# Patient Record
Sex: Female | Born: 1995 | Race: White | Hispanic: No | Marital: Married | Smoking: Never smoker
Health system: Southern US, Community
[De-identification: ages and names within clinical notes are randomized; demographics above are authoritative.]

## PROBLEM LIST (undated history)

## (undated) DIAGNOSIS — F419 Anxiety disorder, unspecified: Secondary | ICD-10-CM

## (undated) DIAGNOSIS — E282 Polycystic ovarian syndrome: Secondary | ICD-10-CM

## (undated) DIAGNOSIS — N390 Urinary tract infection, site not specified: Secondary | ICD-10-CM

## (undated) DIAGNOSIS — O24419 Gestational diabetes mellitus in pregnancy, unspecified control: Secondary | ICD-10-CM

## (undated) DIAGNOSIS — R7303 Prediabetes: Secondary | ICD-10-CM

## (undated) HISTORY — DX: Anxiety disorder, unspecified: F41.9

## (undated) HISTORY — PX: TONSILLECTOMY: SUR1361

---

## 2016-01-09 ENCOUNTER — Emergency Department (HOSPITAL_COMMUNITY)
Admission: EM | Admit: 2016-01-09 | Discharge: 2016-01-09 | Disposition: A | Discharge status: Home / Self Care | Payer: Self-pay | Attending: Emergency Medicine | Admitting: Emergency Medicine

## 2016-01-09 ENCOUNTER — Encounter (HOSPITAL_COMMUNITY): Payer: Self-pay

## 2016-01-09 DIAGNOSIS — Z8639 Personal history of other endocrine, nutritional and metabolic disease: Secondary | ICD-10-CM | POA: Insufficient documentation

## 2016-01-09 DIAGNOSIS — T148XXA Other injury of unspecified body region, initial encounter: Secondary | ICD-10-CM

## 2016-01-09 DIAGNOSIS — Z3202 Encounter for pregnancy test, result negative: Secondary | ICD-10-CM | POA: Insufficient documentation

## 2016-01-09 DIAGNOSIS — Y929 Unspecified place or not applicable: Secondary | ICD-10-CM | POA: Insufficient documentation

## 2016-01-09 DIAGNOSIS — Z880 Allergy status to penicillin: Secondary | ICD-10-CM | POA: Insufficient documentation

## 2016-01-09 DIAGNOSIS — Y939 Activity, unspecified: Secondary | ICD-10-CM | POA: Insufficient documentation

## 2016-01-09 DIAGNOSIS — Z87440 Personal history of urinary (tract) infections: Secondary | ICD-10-CM | POA: Insufficient documentation

## 2016-01-09 DIAGNOSIS — X58XXXA Exposure to other specified factors, initial encounter: Secondary | ICD-10-CM | POA: Insufficient documentation

## 2016-01-09 DIAGNOSIS — Y998 Other external cause status: Secondary | ICD-10-CM | POA: Insufficient documentation

## 2016-01-09 DIAGNOSIS — R350 Frequency of micturition: Secondary | ICD-10-CM | POA: Insufficient documentation

## 2016-01-09 DIAGNOSIS — M545 Low back pain: Secondary | ICD-10-CM | POA: Insufficient documentation

## 2016-01-09 DIAGNOSIS — T148 Other injury of unspecified body region: Secondary | ICD-10-CM | POA: Insufficient documentation

## 2016-01-09 HISTORY — DX: Prediabetes: R73.03

## 2016-01-09 HISTORY — DX: Urinary tract infection, site not specified: N39.0

## 2016-01-09 HISTORY — DX: Polycystic ovarian syndrome: E28.2

## 2016-01-09 LAB — URINALYSIS, ROUTINE W REFLEX MICROSCOPIC
BILIRUBIN URINE: NEGATIVE
Glucose, UA: NEGATIVE mg/dL
HGB URINE DIPSTICK: NEGATIVE
Ketones, ur: 15 mg/dL — AB
Leukocytes, UA: NEGATIVE
NITRITE: NEGATIVE
PH: 6 (ref 5.0–8.0)
Protein, ur: NEGATIVE mg/dL
SPECIFIC GRAVITY, URINE: 1.025 (ref 1.005–1.030)

## 2016-01-09 LAB — POC URINE PREG, ED: Preg Test, Ur: NEGATIVE

## 2016-01-09 MED ORDER — IBUPROFEN 600 MG PO TABS: 600.0000 mg | ORAL_TABLET | Freq: Four times a day (QID) | ORAL | Status: DC | PRN

## 2016-01-09 NOTE — ED Provider Notes (Signed)
CSN: [id number]     Arrival date & time 01/09/16  1741 History  By signing my name below, I, Linna DarnerRussell Turner, attest that this documentation has been prepared under the direction and in the presence of non-physician practitioner, Melburn HakeNicole Nadeau, PA-C. Electronically Signed: Linna Darnerussell Turner, Scribe. 01/09/2016. 6:22 PM.   Chief Complaint  Patient presents with  . Hip Pain    The history is provided by the patient. No language interpreter was used.     HPI Comments: Tara Porter is a 20 y.o. female with h/o prediabetes, PCOS and UTI who presents to the Emergency Department complaining of sudden onset, constant, worsening, dull, sharp, throbbing, anterior left hip pain for the last two weeks. Pt states that her pain radiates into her left lower back. She notes that her pain does not radiate down her left leg. Pt reports pain exacerbation with general movement, bending, palpation to her left hip/lower back, and laying flat. Pt also notes associated increased urinary frequency. She has not tried any medications for her pain. Pt states that she has not experienced similar pain in the past. Pt reports that she initially noticed her pain one morning before she went to work; she denies recent activity change or known cause of her pain. Denies any recent fall. Pt has not seen a chiropractor or acupuncturist recently. She has no h/o cancer, back pain, or IV drug use. Pt's LNMP was April 1st of this year. She is not on birth control. She denies fever, chills, dysuria, hematuria, abdominal pain, neck pain, bowel/bladder incontinence, numbness/tingling, neuro deficits, nausea, vomiting, constipation, or any other associated symptoms. She is ambulatory.   Past Medical History  Diagnosis Date  . UTI (lower urinary tract infection)   . PCOS (polycystic ovarian syndrome)   . Prediabetes    Past Surgical History  Procedure Laterality Date  . Tonsillectomy     History reviewed. No pertinent family history. Social  History  Substance Use Topics  . Smoking status: Never Smoker   . Smokeless tobacco: None  . Alcohol Use: Yes     Comment: occ   OB History    No data available     Review of Systems  Constitutional: Negative for fever.  Gastrointestinal: Negative for nausea, vomiting, abdominal pain and constipation.       Negative for bowel incontinence.  Genitourinary: Positive for frequency. Negative for dysuria and hematuria.       Negative for bladder incontinence.  Musculoskeletal: Positive for back pain (left lower) and arthralgias (left anterior hip). Negative for neck pain.  Neurological: Negative for numbness.       Negative for sensation loss.   Allergies  Amoxicillin  Home Medications   Prior to Admission medications   Medication Sig Start Date End Date Taking? Authorizing Provider  ibuprofen (ADVIL,MOTRIN) 600 MG tablet Take 1 tablet (600 mg total) by mouth every 6 (six) hours as needed. 01/09/16   Satira SarkNicole Elizabeth Nadeau, PA-C   BP 150/99 mmHg  Pulse 84  Temp(Src) 98.3 F (36.8 C) (Oral)  Resp 16  Ht 5\' 3"  (1.6 m)  Wt 122.471 kg  BMI 47.84 kg/m2  SpO2 100%  LMP 11/24/2015 (Approximate) Physical Exam  Constitutional: She is oriented to person, place, and time. She appears well-developed and well-nourished.  HENT:  Head: Normocephalic and atraumatic.  Eyes: Conjunctivae and EOM are normal. Right eye exhibits no discharge. Left eye exhibits no discharge. No scleral icterus.  Neck: Normal range of motion. Neck supple.  Cardiovascular: Normal rate, regular  rhythm, normal heart sounds and intact distal pulses.   Heart rate 92.  Pulmonary/Chest: Effort normal and breath sounds normal. No respiratory distress. She has no wheezes. She has no rales. She exhibits no tenderness.  Abdominal: Soft. She exhibits no distension and no mass. There is no tenderness. There is no rebound and no guarding.  No CVA tenderness.  Musculoskeletal: Normal range of motion. She exhibits tenderness.  She exhibits no edema.       Left hip: She exhibits tenderness. She exhibits normal range of motion, normal strength, no swelling, no crepitus, no deformity and no laceration.       Legs: No midline C, T, or L tenderness. Full range of motion of neck and back. Full range of motion of bilateral upper and lower extremities, with 5/5 strength. Mild tenderness to palpation over left lumbar paraspinal muscles and left anterior proximal thigh/hip. Sensation intact. 2+ radial and PT pulses. Cap refill <2 seconds. Patient able to stand and ambulate without assistance.   Neurological: She is alert and oriented to person, place, and time.  Skin: Skin is warm and dry.  Nursing note and vitals reviewed.   ED Course  Procedures (including critical care time)  DIAGNOSTIC STUDIES: Oxygen Saturation is 100% on RA, normal by my interpretation.    COORDINATION OF CARE: 6:22 PM Discussed treatment plan with pt at bedside and pt agreed to plan.  Labs Review Labs Reviewed  URINALYSIS, ROUTINE W REFLEX MICROSCOPIC (NOT AT [facility]) - Abnormal; Notable for the following:    Ketones, ur 15 (*)    All other components within normal limits  POC URINE PREG, ED    Imaging Review No results found. I have personally reviewed and evaluated these images and lab results as part of my medical decision-making.   EKG Interpretation None      MDM   Final diagnoses:  Muscle strain    Patient presents with right anterior hip pain that radiates around to her lower back. Denies any known injury, trauma or fall. She also reports having urinary frequency over the past few days. Denies fever. VSS. Exam revealed mild tenderness over left lumbar paraspinal muscles and left anterior proximal thigh/hip. No pelvic instability. No back pain red flags. No neuro deficits. Patient able to stand and ambulate without assistance. Full range of motion of bilateral hips. No abdominal tenderness. I suspect pt's pain is likely  musculoskeletal in etiology, however due to patient reporting urinary frequency and noting her LMP was 11/15/15, will order urine pregnancy and UA to evaluate for UTI/kidney stone. Pregnancy negative. UA unremarkable. I do not feel that any imaging is warranted at this time based off of exam and reported history. Plan to discharge patient home with symptomatic treatment including NSAIDs. Patient given information to follow-up with PCP if her symptoms do not improve over the next week. Discussed return precautions with patient.  I personally performed the services described in this documentation, which was scribed in my presence. The recorded information has been reviewed and is accurate.   Satira Sark [place], [place] 01/09/16 2106  Marily Memos, MD 01/10/16 [phone]

## 2016-01-09 NOTE — ED Notes (Signed)
Pt from home with complaint of left hip pain, worse with movement x 2 weeks. Pt ambulatory.

## 2016-01-09 NOTE — ED Notes (Signed)
Pt verbalized understanding of d/c instructions and has no further questions. Pt stable and NAD.  

## 2016-01-09 NOTE — Discharge Instructions (Signed)
I recommend taking 600 mg ibuprofen 4 times daily as needed for pain relief. You may also apply ice and/or heat to affected area for 15-20 minutes 3-4 times daily as needed for pain relief. Refrain from doing any squatting, heavy lifting or repetitive movements that exacerbate her symptoms for the next few days. Please follow up with a primary care provider from the Resource Guide provided below in one week if your symptoms have not improved. Please return to the Emergency Department if symptoms worsen or new onset of fever, numbness, tingling, groin numbness, abdominal pain, urinary retention, loss of bowel or bladder, weakness.

## 2016-11-30 ENCOUNTER — Inpatient Hospital Stay (HOSPITAL_COMMUNITY)
Admission: AD | Admit: 2016-11-30 | Discharge: 2016-11-30 | Disposition: A | Discharge status: Home / Self Care | Payer: 59 | Source: Ambulatory Visit | Attending: Obstetrics and Gynecology | Admitting: Obstetrics and Gynecology

## 2016-11-30 ENCOUNTER — Encounter (HOSPITAL_COMMUNITY): Payer: Self-pay

## 2016-11-30 ENCOUNTER — Inpatient Hospital Stay (HOSPITAL_COMMUNITY): Payer: 59

## 2016-11-30 DIAGNOSIS — O99280 Endocrine, nutritional and metabolic diseases complicating pregnancy, unspecified trimester: Secondary | ICD-10-CM | POA: Insufficient documentation

## 2016-11-30 DIAGNOSIS — Z7984 Long term (current) use of oral hypoglycemic drugs: Secondary | ICD-10-CM | POA: Diagnosis not present

## 2016-11-30 DIAGNOSIS — N939 Abnormal uterine and vaginal bleeding, unspecified: Secondary | ICD-10-CM | POA: Diagnosis present

## 2016-11-30 DIAGNOSIS — O039 Complete or unspecified spontaneous abortion without complication: Secondary | ICD-10-CM

## 2016-11-30 DIAGNOSIS — Z3A00 Weeks of gestation of pregnancy not specified: Secondary | ICD-10-CM | POA: Insufficient documentation

## 2016-11-30 DIAGNOSIS — O4690 Antepartum hemorrhage, unspecified, unspecified trimester: Secondary | ICD-10-CM | POA: Diagnosis not present

## 2016-11-30 DIAGNOSIS — O209 Hemorrhage in early pregnancy, unspecified: Secondary | ICD-10-CM

## 2016-11-30 DIAGNOSIS — E282 Polycystic ovarian syndrome: Secondary | ICD-10-CM | POA: Diagnosis not present

## 2016-11-30 DIAGNOSIS — Z880 Allergy status to penicillin: Secondary | ICD-10-CM | POA: Insufficient documentation

## 2016-11-30 LAB — HCG, QUANTITATIVE, PREGNANCY: HCG, BETA CHAIN, QUANT, S: 4798 m[IU]/mL — AB (ref <5)

## 2016-11-30 NOTE — MAU Provider Note (Signed)
History     Chief Complaint  Patient presents with  . Vaginal Bleeding   21 yo G1P0 SWF presents with c/o vaginal bleeding and passage of clots. Pt started spotting yesterday and it has  progressed to more clots  OB History    Gravida Para Term Preterm AB Living   2             SAB TAB Ectopic Multiple Live Births                  Past Medical History:  Diagnosis Date  . PCOS (polycystic ovarian syndrome)   . PCOS (polycystic ovarian syndrome)   . Prediabetes   . UTI (lower urinary tract infection)     Past Surgical History:  Procedure Laterality Date  . TONSILLECTOMY      History reviewed. No pertinent family history.  Social History  Substance Use Topics  . Smoking status: Never Smoker  . Smokeless tobacco: Never Used  . Alcohol use Yes     Comment: occ    Allergies:  Allergies  Allergen Reactions  . Amoxicillin Hives    Has patient had a PCN reaction causing immediate rash, facial/tongue/throat swelling, SOB or lightheadedness with hypotension: no Has patient had a PCN reaction causing severe rash involving mucus membranes or skin necrosis: no Has patient had a PCN reaction that required hospitalizationno Has patient had a PCN reaction occurring within the last 10 years: no If all of the above answers are "NO", then may proceed with Cephalosporin use.      Prescriptions Prior to Admission  Medication Sig Dispense Refill Last Dose  . calcium carbonate (TUMS - DOSED IN MG ELEMENTAL CALCIUM) 500 MG chewable tablet Chew 1 tablet by mouth as needed for indigestion or heartburn.   11/29/2016 at Unknown time  . metFORMIN (GLUCOPHAGE-XR) 500 MG 24 hr tablet Take 1 po qhs x 2 wks then take 2 tabs po qhs x 2 wks then take 3 tabs po qhs x 2 wks then take 4 tabs po qhs   11/29/2016 at Unknown time  . Prenatal Vit-Fe Fumarate-FA (PRENATAL MULTIVITAMIN) TABS tablet Take 1 tablet by mouth daily at 12 noon.   11/29/2016 at Unknown time     Physical Exam   Blood  pressure 130/64, pulse 99, temperature 98.4 F (36.9 C), temperature source Oral, resp. rate 18, height  (1.6 m), weight (!) 141.6 kg (312 lb 4 oz), last menstrual period 08/16/2016, SpO2 99 %.  General appearance: alert, cooperative and no distress Lungs: clear to auscultation bilaterally Heart: regular rate and rhythm, S1, S2 normal, no murmur, click, rub or gallop Abdomen: soft obese non tender Pelvic: vulva  smeared with blood , vagina; scant blood in vault. cervix closed/long.  uterus bulky adnexa non palp Extremities: no edema, redness or tenderness in the calves or thighs ED Course  IMP; vaginal bleeding in pregnancy P) TVS  MDM Addendum US Ob Comp Less 14 Wks  Result Date: 11/30/2016 CLINICAL DATA:  Inconclusive fetal viability. Vaginal bleeding with passage of clots. EXAM: OBSTETRIC <14 WK [place] AND TRANSVAGINAL OB US TECHNIQUE: Both transabdominal and transvaginal ultrasound examinations were performed for complete evaluation of the gestation as well as the maternal uterus, adnexal regions, and pelvic cul-de-sac. Transvaginal technique was performed to assess early pregnancy. COMPARISON:  None. FINDINGS: Intrauterine gestational sac: None Yolk sac:  Not Visualized. Embryo:  Not Visualized. Cardiac Activity: Not Visualized. Maternal uterus/adnexae: Subchorionic hemorrhage: Right ovary: Corpus luteal cyst noted. Left ovary:  Normal. Other :The endometrium measures up to 1.5 cm Free fluid:  Trace free fluid noted within the pelvis. IMPRESSION: 1. No intrauterine gestational sac, yolk sac, or fetal pole identified. Differential considerations include intrauterine pregnancy too early to be sonographically visualized, missed abortion, or ectopic pregnancy. Followup ultrasound is recommended in 10-14 days for further evaluation. 2. The endometrium measures 15 mm in maximum thickness. In the setting of missed abortion an endometrial thickness of greater than 10 mm is suspicious for retained  products of conception. Electronically Signed   By: Signa Kell M.D.   On: 11/30/2016 11:17   US Ob Transvaginal  Result Date: 11/30/2016 CLINICAL DATA:  Inconclusive fetal viability. Vaginal bleeding with passage of clots. EXAM: OBSTETRIC <14 WK [place] AND TRANSVAGINAL OB US TECHNIQUE: Both transabdominal and transvaginal ultrasound examinations were performed for complete evaluation of the gestation as well as the maternal uterus, adnexal regions, and pelvic cul-de-sac. Transvaginal technique was performed to assess early pregnancy. COMPARISON:  None. FINDINGS: Intrauterine gestational sac: None Yolk sac:  Not Visualized. Embryo:  Not Visualized. Cardiac Activity: Not Visualized. Maternal uterus/adnexae: Subchorionic hemorrhage: Right ovary: Corpus luteal cyst noted. Left ovary: Normal. Other :The endometrium measures up to 1.5 cm Free fluid:  Trace free fluid noted within the pelvis. IMPRESSION: 1. No intrauterine gestational sac, yolk sac, or fetal pole identified. Differential considerations include intrauterine pregnancy too early to be sonographically visualized, missed abortion, or ectopic pregnancy. Followup ultrasound is recommended in 10-14 days for further evaluation. 2. The endometrium measures 15 mm in maximum thickness. In the setting of missed abortion an endometrial thickness of greater than 10 mm is suspicious for retained products of conception. Electronically Signed   By: Signa Kell M.D.   On: 11/30/2016 11:17   IMP: SAB given prior sono in the office that showed IUP. Some residual POC RH neg but pt already had received Rhophylac P) d/c home. f/u 2 weeks. Heartstring info given. SAB precautions reviewed. Pelvic rest  Deray Dawes A, MD 10:01 AM 11/30/2016

## 2016-11-30 NOTE — Discharge Instructions (Signed)
SAB precautions. CALL  IF TEMP>100.4, NOTHING PER VAGINA X 2 WK, CALL IF SOAKING A MAXI  PAD EVERY HOUR OR MORE FREQUENTLY

## 2016-11-30 NOTE — MAU Note (Signed)
[facility] +preg hcg of 870; no  preg seen on [place]. Pt was to f/u with OB

## 2016-11-30 NOTE — MAU Note (Signed)
Urine in lab 

## 2016-12-02 ENCOUNTER — Other Ambulatory Visit: Payer: Self-pay | Admitting: Obstetrics and Gynecology

## 2017-09-30 ENCOUNTER — Encounter (HOSPITAL_COMMUNITY): Payer: Self-pay

## 2017-09-30 ENCOUNTER — Other Ambulatory Visit (HOSPITAL_COMMUNITY): Payer: Self-pay | Admitting: Obstetrics and Gynecology

## 2017-09-30 ENCOUNTER — Ambulatory Visit (HOSPITAL_COMMUNITY)
Admission: RE | Admit: 2017-09-30 | Discharge: 2017-09-30 | Disposition: A | Discharge status: Home / Self Care | Payer: 59 | Source: Ambulatory Visit | Attending: Obstetrics and Gynecology | Admitting: Obstetrics and Gynecology

## 2017-09-30 DIAGNOSIS — O26899 Other specified pregnancy related conditions, unspecified trimester: Secondary | ICD-10-CM

## 2017-09-30 DIAGNOSIS — R1032 Left lower quadrant pain: Secondary | ICD-10-CM

## 2017-09-30 DIAGNOSIS — O26892 Other specified pregnancy related conditions, second trimester: Secondary | ICD-10-CM | POA: Insufficient documentation

## 2017-09-30 DIAGNOSIS — R1030 Lower abdominal pain, unspecified: Secondary | ICD-10-CM | POA: Diagnosis present

## 2017-09-30 DIAGNOSIS — Z3A25 25 weeks gestation of pregnancy: Secondary | ICD-10-CM | POA: Insufficient documentation

## 2017-10-12 ENCOUNTER — Other Ambulatory Visit: Payer: Self-pay

## 2017-10-12 ENCOUNTER — Inpatient Hospital Stay (HOSPITAL_COMMUNITY): Payer: 59

## 2017-10-12 ENCOUNTER — Inpatient Hospital Stay (HOSPITAL_COMMUNITY)
Admission: AD | Admit: 2017-10-12 | Discharge: 2017-10-21 | DRG: 783 | Disposition: A | Discharge status: Home / Self Care | Payer: 59 | Source: Ambulatory Visit | Attending: Obstetrics and Gynecology | Admitting: Obstetrics and Gynecology

## 2017-10-12 ENCOUNTER — Encounter (HOSPITAL_COMMUNITY): Payer: Self-pay

## 2017-10-12 DIAGNOSIS — Z6791 Unspecified blood type, Rh negative: Secondary | ICD-10-CM | POA: Diagnosis not present

## 2017-10-12 DIAGNOSIS — Z3A28 28 weeks gestation of pregnancy: Secondary | ICD-10-CM

## 2017-10-12 DIAGNOSIS — D62 Acute posthemorrhagic anemia: Secondary | ICD-10-CM | POA: Diagnosis not present

## 2017-10-12 DIAGNOSIS — O4290 Premature rupture of membranes, unspecified as to length of time between rupture and onset of labor, unspecified weeks of gestation: Secondary | ICD-10-CM

## 2017-10-12 DIAGNOSIS — O4692 Antepartum hemorrhage, unspecified, second trimester: Secondary | ICD-10-CM | POA: Diagnosis present

## 2017-10-12 DIAGNOSIS — O411220 Chorioamnionitis, second trimester, not applicable or unspecified: Secondary | ICD-10-CM | POA: Diagnosis present

## 2017-10-12 DIAGNOSIS — O42912 Preterm premature rupture of membranes, unspecified as to length of time between rupture and onset of labor, second trimester: Principal | ICD-10-CM | POA: Diagnosis present

## 2017-10-12 DIAGNOSIS — O24410 Gestational diabetes mellitus in pregnancy, diet controlled: Secondary | ICD-10-CM

## 2017-10-12 DIAGNOSIS — Z3A27 27 weeks gestation of pregnancy: Secondary | ICD-10-CM

## 2017-10-12 DIAGNOSIS — O99344 Other mental disorders complicating childbirth: Secondary | ICD-10-CM | POA: Diagnosis present

## 2017-10-12 DIAGNOSIS — D649 Anemia, unspecified: Secondary | ICD-10-CM

## 2017-10-12 DIAGNOSIS — O99214 Obesity complicating childbirth: Secondary | ICD-10-CM | POA: Diagnosis present

## 2017-10-12 DIAGNOSIS — Z880 Allergy status to penicillin: Secondary | ICD-10-CM

## 2017-10-12 DIAGNOSIS — O24425 Gestational diabetes mellitus in childbirth, controlled by oral hypoglycemic drugs: Secondary | ICD-10-CM | POA: Diagnosis present

## 2017-10-12 DIAGNOSIS — O26892 Other specified pregnancy related conditions, second trimester: Secondary | ICD-10-CM | POA: Diagnosis present

## 2017-10-12 DIAGNOSIS — F419 Anxiety disorder, unspecified: Secondary | ICD-10-CM | POA: Diagnosis present

## 2017-10-12 DIAGNOSIS — E282 Polycystic ovarian syndrome: Secondary | ICD-10-CM | POA: Diagnosis present

## 2017-10-12 DIAGNOSIS — N838 Other noninflammatory disorders of ovary, fallopian tube and broad ligament: Secondary | ICD-10-CM | POA: Diagnosis present

## 2017-10-12 DIAGNOSIS — O3482 Maternal care for other abnormalities of pelvic organs, second trimester: Secondary | ICD-10-CM | POA: Diagnosis present

## 2017-10-12 DIAGNOSIS — O864 Pyrexia of unknown origin following delivery: Secondary | ICD-10-CM

## 2017-10-12 DIAGNOSIS — O99284 Endocrine, nutritional and metabolic diseases complicating childbirth: Secondary | ICD-10-CM | POA: Diagnosis present

## 2017-10-12 DIAGNOSIS — O9081 Anemia of the puerperium: Secondary | ICD-10-CM | POA: Diagnosis not present

## 2017-10-12 HISTORY — DX: Gestational diabetes mellitus in pregnancy, unspecified control: O24.419

## 2017-10-12 LAB — COMPREHENSIVE METABOLIC PANEL
ALT: 14 U/L (ref 14–54)
AST: 17 U/L (ref 15–41)
Albumin: 2.9 g/dL — ABNORMAL LOW (ref 3.5–5.0)
Alkaline Phosphatase: 73 U/L (ref 38–126)
Anion gap: 9 (ref 5–15)
BUN: 6 mg/dL (ref 6–20)
CO2: 20 mmol/L — ABNORMAL LOW (ref 22–32)
Calcium: 8.5 mg/dL — ABNORMAL LOW (ref 8.9–10.3)
Chloride: 102 mmol/L (ref 101–111)
Creatinine, Ser: 0.36 mg/dL — ABNORMAL LOW (ref 0.44–1.00)
GFR calc Af Amer: >60 mL/min (ref >60)
GFR calc non Af Amer: >60 mL/min (ref >60)
Glucose, Bld: 93 mg/dL (ref 65–99)
Potassium: 3.9 mmol/L (ref 3.5–5.1)
Sodium: 131 mmol/L — ABNORMAL LOW (ref 135–145)
Total Bilirubin: 0.6 mg/dL (ref 0.3–1.2)
Total Protein: 6.2 g/dL — ABNORMAL LOW (ref 6.5–8.1)

## 2017-10-12 LAB — CBC
HCT: 32.7 % — ABNORMAL LOW (ref 36.0–46.0)
HEMOGLOBIN: 10.5 g/dL — AB (ref 12.0–15.0)
MCH: 26.3 pg (ref 26.0–34.0)
MCHC: 32.1 g/dL (ref 30.0–36.0)
MCV: 82 fL (ref 78.0–100.0)
PLATELETS: 360 10*3/uL (ref 150–400)
RBC: 3.99 MIL/uL (ref 3.87–5.11)
RDW: 14.1 % (ref 11.5–15.5)
WBC: 21.7 10*3/uL — ABNORMAL HIGH (ref 4.0–10.5)

## 2017-10-12 LAB — TYPE AND SCREEN
ABO/RH(D): A NEG
Antibody Screen: NEGATIVE

## 2017-10-12 LAB — PROTEIN / CREATININE RATIO, URINE
Creatinine, Urine: 88 mg/dL
Protein Creatinine Ratio: 0.11 mg/mg{Cre} (ref 0.00–0.15)
Total Protein, Urine: 10 mg/dL

## 2017-10-12 LAB — GLUCOSE, CAPILLARY
GLUCOSE-CAPILLARY: 172 mg/dL — AB (ref 65–99)
Glucose-Capillary: 127 mg/dL — ABNORMAL HIGH (ref 65–99)
Glucose-Capillary: 147 mg/dL — ABNORMAL HIGH (ref 65–99)

## 2017-10-12 LAB — ABO/RH: ABO/RH(D): A NEG

## 2017-10-12 LAB — AMNISURE RUPTURE OF MEMBRANE (ROM) NOT AT ARMC: Amnisure ROM: NEGATIVE

## 2017-10-12 LAB — URIC ACID: URIC ACID, SERUM: 3.9 mg/dL (ref 2.3–6.6)

## 2017-10-12 MED ORDER — ACETAMINOPHEN 325 MG PO TABS
650.0000 mg | ORAL_TABLET | ORAL | Status: DC | PRN
  Administered 2017-10-13 – 2017-10-16 (×6): 650 mg via ORAL
  Filled 2017-10-12 (×6): qty 2

## 2017-10-12 MED ORDER — BETAMETHASONE SOD PHOS & ACET 6 (3-3) MG/ML IJ SUSP
12.0000 mg | INTRAMUSCULAR | Status: AC
Start: 2017-10-12 — End: 2017-10-13
  Administered 2017-10-12 – 2017-10-13 (×2): 12 mg via INTRAMUSCULAR
  Filled 2017-10-12 (×2): qty 2

## 2017-10-12 MED ORDER — ZOLPIDEM TARTRATE 5 MG PO TABS
5.0000 mg | ORAL_TABLET | Freq: Every evening | ORAL | Status: DC | PRN
  Administered 2017-10-13: 5 mg via ORAL
  Filled 2017-10-12: qty 1

## 2017-10-12 MED ORDER — SODIUM CHLORIDE 0.9% FLUSH: 3.0000 mL | INTRAVENOUS | Status: DC | PRN

## 2017-10-12 MED ORDER — SODIUM CHLORIDE 0.9% FLUSH
3.0000 mL | Freq: Two times a day (BID) | INTRAVENOUS | Status: DC
  Administered 2017-10-12 – 2017-10-16 (×6): 3 mL via INTRAVENOUS

## 2017-10-12 MED ORDER — DOCUSATE SODIUM 100 MG PO CAPS
100.0000 mg | ORAL_CAPSULE | Freq: Every day | ORAL | Status: DC
  Administered 2017-10-12 – 2017-10-15 (×4): 100 mg via ORAL
  Filled 2017-10-12 (×6): qty 1

## 2017-10-12 MED ORDER — PRENATAL MULTIVITAMIN CH
1.0000 | ORAL_TABLET | Freq: Every day | ORAL | Status: DC
  Administered 2017-10-12 – 2017-10-16 (×5): 1 via ORAL
  Filled 2017-10-12 (×6): qty 1

## 2017-10-12 MED ORDER — CALCIUM CARBONATE ANTACID 500 MG PO CHEW
2.0000 | CHEWABLE_TABLET | ORAL | Status: DC | PRN
  Administered 2017-10-14: 400 mg via ORAL
  Filled 2017-10-12: qty 2

## 2017-10-12 MED ORDER — SODIUM CHLORIDE 0.9 % IV SOLN: 250.0000 mL | INTRAVENOUS | Status: DC | PRN

## 2017-10-12 NOTE — H&P (Signed)
Tara Porter is a 22 y.o. female presenting @v  27 4/[redacted] weeks gestation with c/o leaking fluid and vaginal bleeding. Evaluation done showed neg amniosure. sono done: nl AFI( 17cm), AGA baby and no evidence of abruption or previa. Pt ambulated to bathroom and had another episode of vaginal bleeding OB History    Gravida Para Term Preterm AB Living   2       1     SAB TAB Ectopic Multiple Live Births   1             Past Medical History:  Diagnosis Date  . PCOS (polycystic ovarian syndrome)   . PCOS (polycystic ovarian syndrome)   . Prediabetes   . UTI (lower urinary tract infection)    Past Surgical History:  Procedure Laterality Date  . TONSILLECTOMY     Family History: family history is not on file. Social History:  reports that  has never smoked. she has never used smokeless tobacco. She reports that she does not drink alcohol or use drugs.     Maternal Diabetes: Yes:  Diabetes Type:  Diet controlled Genetic Screening: Normal Maternal Ultrasounds/Referrals: Normal Fetal Ultrasounds or other Referrals:  None Maternal Substance Abuse:  No Significant Maternal Medications:  None Significant Maternal Lab Results:  Lab values include: Rh negativeGBS to be done Other Comments:  morbid obesity, PCOS  Review of Systems  All other systems reviewed and are negative.  History   Blood pressure (!) 166/66, pulse 97, temperature 97.8 F (36.6 C), temperature source Oral, resp. rate 19, height 5\' 3"  (1.6 m), weight (!) 142.4 kg (314 lb), SpO2 98 %, unknown if currently breastfeeding.  Patient Vitals for the past 24 hrs:  BP Temp Temp src Pulse Resp SpO2 Height Weight  10/12/17 0737 (!) 165/81 - - (!) 102 - - - -  10/12/17 0711 (!) 154/74 - - 96 - - - -  10/12/17 0659 (!) 166/66 97.8 F (36.6 C) Oral 97 19 98 % - -  10/12/17 0335 (!) 128/57 97.9 F (36.6 C) Oral 93 19 99 % 5\' 3"  (1.6 m) (!) 142.4 kg (314 lb)   Exam Physical Exam  Constitutional: She is oriented to person, place,  and time. She appears well-developed and well-nourished.  HENT:  Head: Atraumatic.  Eyes: EOM are normal.  Neck: Neck supple.  Cardiovascular: Regular rhythm.  Respiratory: Breath sounds normal.  GI: Soft.  Obese nontender  Genitourinary:  Genitourinary Comments: Cervix closed on visual exam. Scant blood  Musculoskeletal: She exhibits no edema.  Neurological: She is alert and oriented to person, place, and time.  Skin: Skin is warm and dry.    Prenatal labs: ABO, Rh:  A negative Antibody:  neg Rubella:  Immune RPR:   NR HBsAg:   Neg HIV:   neg GBS:   to be done  Assessment/Plan: 2nd trimester vaginal bleeding unexplained IUP @ 27 4/7 wks Class A1 GDM ? Elevated BP due to anxiety P) admit. BMZ x 2. CBG. Continuous fetal monitoring. GBS cx. Monitor bleeding. PIH labs, protein/creatinine ratio    Tara Porter A Tara Porter 10/12/2017, 7:32 AM

## 2017-10-12 NOTE — Discharge Instructions (Signed)
Monitor bleeding. Call if recurrence of symptoms. Keep scheduled OB appt

## 2017-10-12 NOTE — Progress Notes (Signed)
Initial Nutrition Assessment  DOCUMENTATION CODES:   Morbid obesity  INTERVENTION:  GDM diet Double protein portions, CHO limit increased to 45 g at B, 60 g at L& D  NUTRITION DIAGNOSIS:  Increased nutrient needs related to (pregnancy and fetal growth requirments) as evidenced by (27 weeks IUP). GOAL:  Patient will meet greater than or equal to 90% of their needs  MONITOR:  Labs REASON FOR ASSESSMENT:  Antenatal, Gestational Diabetes   ASSESSMENT:  27 4/7 weks, vaginal bleed. Hx of PCOS, A1 GDM.  Pre-preg weight 280 lbs, BMI 49.7. 34 lb weight gain Pt hungry between meals   Diet Order:  Diet Carb Modified Diet gestational carb mod Room service appropriate? Yes; Fluid consistency: Thin  EDUCATION NEEDS:  No education needs have been identified at this time Told to eliminate sugar and limit CHO intake at Physicians office. Glucose levels at home reported by pt to be wnl  Skin:  Skin Assessment: Reviewed RN Assessment Height:   Ht Readings from Last 1 Encounters:  10/12/17 5\' 3"  (1.6 m)   Weight:   Wt Readings from Last 1 Encounters:  10/12/17 (!) 314 lb (142.4 kg)   Ideal Body Weight:   115 lbs  BMI:  Body mass index is 55.62 kg/m.  Estimated Nutritional Needs:  Kcal:  2400-2600 Protein:  105-115 g Fluid:  2.7 L   Tara Porter M.Odis LusterEd. R.D. LDN Neonatal Nutrition Support Specialist/RD III Pager [phone]      Phone [phone]

## 2017-10-12 NOTE — MAU Note (Signed)
Pt reports that she woke up and went to the bathroom and had a gush of pinkish fluid. Has continued to trickle out. Pt reports feeling lower abdominal cramping that started after the gush. Rates 3/10. States she has not felt baby move since about midnight. FHT 155 in triage

## 2017-10-12 NOTE — MAU Provider Note (Signed)
History     Chief Complaint  Patient presents with  . Rupture of Membranes  . Vaginal Bleeding   22 yo G2P0010 SWF @ 27w 4d pregnant presents with c/o leaking fluid with blood this am. (+) abdominal cramping. PNC complicated by morbid obesity, class A1 GDM  OB History    Gravida Para Term Preterm AB Living   2       1     SAB TAB Ectopic Multiple Live Births   1              Past Medical History:  Diagnosis Date  . PCOS (polycystic ovarian syndrome)   . PCOS (polycystic ovarian syndrome)   . Prediabetes   . UTI (lower urinary tract infection)     Past Surgical History:  Procedure Laterality Date  . TONSILLECTOMY      History reviewed. No pertinent family history.  Social History   Tobacco Use  . Smoking status: Never Smoker  . Smokeless tobacco: Never Used  Substance Use Topics  . Alcohol use: No    Frequency: Never  . Drug use: No    Allergies:  Allergies  Allergen Reactions  . Amoxicillin Hives    Has patient had a PCN reaction causing immediate rash, facial/tongue/throat swelling, SOB or lightheadedness with hypotension: no Has patient had a PCN reaction causing severe rash involving mucus membranes or skin necrosis: no Has patient had a PCN reaction that required hospitalizationno Has patient had a PCN reaction occurring within the last 10 years: no If all of the above answers are "NO", then may proceed with Cephalosporin use.      Medications Prior to Admission  Medication Sig Dispense Refill Last Dose  . calcium carbonate (TUMS - DOSED IN MG ELEMENTAL CALCIUM) 500 MG chewable tablet Chew 1 tablet by mouth as needed for indigestion or heartburn.   Past Week at Unknown time  . Prenatal Vit-Fe Fumarate-FA (PRENATAL MULTIVITAMIN) TABS tablet Take 1 tablet by mouth daily at 12 noon.   10/11/2017 at 0900  . metFORMIN (GLUCOPHAGE-XR) 500 MG 24 hr tablet Take 1 po qhs x 2 wks then take 2 tabs po qhs x 2 wks then take 3 tabs po qhs x 2 wks then take 4 tabs po  qhs   More than a month at Unknown time     Physical Exam   Blood pressure (!) 128/57, pulse 93, temperature 97.9 F (36.6 C), temperature source Oral, resp. rate 19, height 5\' 3"  (1.6 m), weight (!) 142.4 kg (314 lb), SpO2 99 %, unknown if currently breastfeeding.  General appearance: alert, cooperative, no distress and morbidly obese Lungs: clear to auscultation bilaterally Heart: regular rate and rhythm, S1, S2 normal, no murmur, click, rub or gallop Abdomen: gravid soft nontender Pelvic: external genitalia normal and vagina. scant blood. cervix visually closed inner thigh smeared with blood Extremity: no edema  Tracing: baseline 140 NR no ctx ED Course  IMP: 2nd trimester vaginal bleeding Leaking fluid IUP@ 27 4/7 weeks Class A1 GDM P) amniosure. OB sono ( EFW, AFI, check placenta MDM   Serita KyleSheronette A Nichelle Renwick, MD 4:28 AM 10/12/2017

## 2017-10-13 LAB — FIBRINOGEN: Fibrinogen: 411 mg/dL (ref 210–475)

## 2017-10-13 LAB — GLUCOSE, CAPILLARY
GLUCOSE-CAPILLARY: 149 mg/dL — AB (ref 65–99)
GLUCOSE-CAPILLARY: 182 mg/dL — AB (ref 65–99)
Glucose-Capillary: 129 mg/dL — ABNORMAL HIGH (ref 65–99)
Glucose-Capillary: 176 mg/dL — ABNORMAL HIGH (ref 65–99)

## 2017-10-13 LAB — PROTIME-INR
INR: 0.98
Prothrombin Time: 12.9 seconds (ref 11.4–15.2)

## 2017-10-13 LAB — PLATELET COUNT: Platelets: 366 10*3/uL (ref 150–400)

## 2017-10-13 LAB — RPR: RPR: NONREACTIVE

## 2017-10-13 LAB — APTT: APTT: 24 s (ref 24–36)

## 2017-10-13 LAB — SAVE SMEAR

## 2017-10-13 NOTE — Progress Notes (Addendum)
HD #2 Iup@ 27 5/7 2nd trim vaginal bleeding Class A1 GDM s/p BMZ x 2  S: pt noted painless vaginal bleeding at 12 noon today. (+) FM  BP (!) 116/54 (BP Location: Right Arm)   Pulse 91   Temp 98.4 F (36.9 C) (Oral)   Resp 18   Ht 5\' 3"  (1.6 m)   Wt (!) 140.6 kg (310 lb 0.2 oz)   SpO2 98%   BMI 54.92 kg/m  Lungs clear to A  Cor RRR Abd: soft gravid non tender Pad: BRB noted on original pad. Now brown blood VE deferred Extremity no edema or calf tenderness  Tracing: baseline 150 NR No ctx noted  CBG  129/149/182  IMP: unexplained 2nd trimester vaginal bleeding ? Marginal separation Morbid obesity Class a1 GDM with elevated BS due to BMZ P) DIC panel. Remain pt. May start metformin if BS persists NICU consult. Start metformin

## 2017-10-14 LAB — CULTURE, BETA STREP (GROUP B ONLY)

## 2017-10-14 LAB — GLUCOSE, CAPILLARY
Glucose-Capillary: 109 mg/dL — ABNORMAL HIGH (ref 65–99)
Glucose-Capillary: 90 mg/dL (ref 65–99)

## 2017-10-14 LAB — AMNISURE RUPTURE OF MEMBRANE (ROM) NOT AT ARMC: Amnisure ROM: POSITIVE

## 2017-10-14 MED ORDER — MAGNESIUM SULFATE BOLUS VIA INFUSION
4.0000 g | Freq: Once | INTRAVENOUS | Status: AC
  Administered 2017-10-14: 4 g via INTRAVENOUS
  Filled 2017-10-14: qty 500

## 2017-10-14 MED ORDER — METFORMIN HCL 500 MG PO TABS
500.0000 mg | ORAL_TABLET | Freq: Two times a day (BID) | ORAL | Status: DC
  Administered 2017-10-14 – 2017-10-16 (×6): 500 mg via ORAL
  Filled 2017-10-14 (×7): qty 1

## 2017-10-14 MED ORDER — SODIUM CHLORIDE 0.9 % IV SOLN
500.0000 mg | INTRAVENOUS | Status: AC
  Administered 2017-10-14 – 2017-10-15 (×2): 500 mg via INTRAVENOUS
  Filled 2017-10-14 (×2): qty 500

## 2017-10-14 MED ORDER — MAGNESIUM SULFATE 40 G IN LACTATED RINGERS - SIMPLE
2.0000 g/h | INTRAVENOUS | Status: AC
  Filled 2017-10-14: qty 500

## 2017-10-14 MED ORDER — LACTATED RINGERS IV SOLN
INTRAVENOUS | Status: DC
  Administered 2017-10-14 – 2017-10-16 (×3): via INTRAVENOUS

## 2017-10-14 MED ORDER — AZITHROMYCIN 250 MG PO TABS: 500.0000 mg | ORAL_TABLET | Freq: Every day | ORAL | Status: DC

## 2017-10-14 NOTE — Progress Notes (Signed)
Called by RN regarding PPROM @ 6:30 PM clear fluid. amni sure done and is positive. GBS Cx neg. Will proceed with PPROM protocol. Await NICU consult and will do magnesium neuro prophylaxis. Pt and family notified of plan

## 2017-10-14 NOTE — Consult Note (Signed)
Asked by Dr.Cousins to provide prenatal consultation for patient at risk for preterm delivery due to PPROM.  Mother is 22 y.o. G2 P0 who is now 27.[redacted] weeks EGA, admitted 2/27 with leaking, bleeding. Was Amniosure negative on admission but positive today.  Also has diet-controlled gestational DM, given metformin today after increased glucose s/p BMZ x 2. Now being started on Mg for neuroprotection.  Discussed usual expectations for preterm infant at [redacted] weeks gestation, including possible needs for DR resuscitation, respiratory support, IV access, and blood products.  Presented risks of death or serious morbidity but noted most infants at 28 wks s/p BMZ do well in the long run. [facility]  Discussed advantages of feeding with mother's milk.  She plans to pump postnatally.  Patient was attentive, had appropriate questions, and was appreciative of my input.  Thank you for consulting Neonatology.  Total time 25 minutes  JWimmer, MD

## 2017-10-15 LAB — GLUCOSE, CAPILLARY
GLUCOSE-CAPILLARY: 102 mg/dL — AB (ref 65–99)
GLUCOSE-CAPILLARY: 103 mg/dL — AB (ref 65–99)
GLUCOSE-CAPILLARY: 125 mg/dL — AB (ref 65–99)
GLUCOSE-CAPILLARY: 71 mg/dL (ref 65–99)
GLUCOSE-CAPILLARY: 82 mg/dL (ref 65–99)
GLUCOSE-CAPILLARY: 92 mg/dL (ref 65–99)

## 2017-10-15 MED ORDER — RHO D IMMUNE GLOBULIN 1500 UNIT/2ML IJ SOSY
300.0000 ug | PREFILLED_SYRINGE | Freq: Once | INTRAMUSCULAR | Status: AC
  Administered 2017-10-15: 300 ug via INTRAVENOUS
  Filled 2017-10-15: qty 2

## 2017-10-15 NOTE — Progress Notes (Signed)
S: leaking fluic (+) FM Denies CTX  O: BP 111/74 (BP Location: Right Arm)   Pulse 88   Temp 97.9 F (36.6 C) (Oral)   Resp 18   Ht 5\' 3"  (1.6 m)   Wt (!) 139.7 kg (308 lb 0.6 oz)   SpO2 100%   BMI 54.57 kg/m  Lungs clear To A COr RRR Abd; gravid soft nontender Pad . Clear fluid with pink tinge Extr: no edema  Tracing: baseline 145 occ variable No ctx  IMP: PPROM on azithromycin GBS cx neg  Class A2 GDM on metformin s/p BMZ Unexplained vaginal bleeding IUP@ 28 weeks Rh negative P) cont antibiotic per protocol. Rhophylac today Cont fetal monitor. Seen by NICU note appreciated

## 2017-10-16 ENCOUNTER — Encounter (HOSPITAL_COMMUNITY)
Admission: AD | Disposition: A | Discharge status: Home / Self Care | Payer: Self-pay | Source: Ambulatory Visit | Attending: Obstetrics and Gynecology

## 2017-10-16 ENCOUNTER — Inpatient Hospital Stay (HOSPITAL_COMMUNITY): Payer: 59 | Admitting: Anesthesiology

## 2017-10-16 ENCOUNTER — Inpatient Hospital Stay (HOSPITAL_COMMUNITY): Payer: 59

## 2017-10-16 ENCOUNTER — Encounter (HOSPITAL_COMMUNITY): Payer: Self-pay

## 2017-10-16 LAB — COMPREHENSIVE METABOLIC PANEL
ALT: 18 U/L (ref 14–54)
AST: 19 U/L (ref 15–41)
Albumin: 2.7 g/dL — ABNORMAL LOW (ref 3.5–5.0)
Alkaline Phosphatase: 70 U/L (ref 38–126)
Anion gap: 8 (ref 5–15)
BUN: 7 mg/dL (ref 6–20)
CO2: 21 mmol/L — ABNORMAL LOW (ref 22–32)
Calcium: 8.5 mg/dL — ABNORMAL LOW (ref 8.9–10.3)
Chloride: 105 mmol/L (ref 101–111)
Creatinine, Ser: 0.4 mg/dL — ABNORMAL LOW (ref 0.44–1.00)
GFR calc Af Amer: >60 mL/min (ref >60)
GFR calc non Af Amer: >60 mL/min (ref >60)
Glucose, Bld: 77 mg/dL (ref 65–99)
Potassium: 3.8 mmol/L (ref 3.5–5.1)
Sodium: 134 mmol/L — ABNORMAL LOW (ref 135–145)
Total Bilirubin: 0.6 mg/dL (ref 0.3–1.2)
Total Protein: 6.3 g/dL — ABNORMAL LOW (ref 6.5–8.1)

## 2017-10-16 LAB — CBC WITH DIFFERENTIAL/PLATELET
Basophils Absolute: 0 10*3/uL (ref 0.0–0.1)
Basophils Relative: 0 %
EOS ABS: 0.3 10*3/uL (ref 0.0–0.7)
Eosinophils Relative: 2 %
HCT: 33.7 % — ABNORMAL LOW (ref 36.0–46.0)
Hemoglobin: 10.7 g/dL — ABNORMAL LOW (ref 12.0–15.0)
LYMPHS ABS: 2.3 10*3/uL (ref 0.7–4.0)
Lymphocytes Relative: 11 %
MCH: 26.9 pg (ref 26.0–34.0)
MCHC: 31.8 g/dL (ref 30.0–36.0)
MCV: 84.7 fL (ref 78.0–100.0)
MONO ABS: 1.1 10*3/uL — AB (ref 0.1–1.0)
MONOS PCT: 5 %
Neutro Abs: 17.8 10*3/uL (ref 1.7–7.7)
Neutrophils Relative %: 82 %
PLATELETS: 374 10*3/uL (ref 150–400)
RBC: 3.98 MIL/uL (ref 3.87–5.11)
RDW: 14.6 % (ref 11.5–15.5)
WBC: 21.6 10*3/uL — ABNORMAL HIGH (ref 4.0–10.5)

## 2017-10-16 LAB — GLUCOSE, CAPILLARY
GLUCOSE-CAPILLARY: 77 mg/dL (ref 65–99)
Glucose-Capillary: 96 mg/dL (ref 65–99)
Glucose-Capillary: 101 mg/dL — ABNORMAL HIGH (ref 65–99)

## 2017-10-16 LAB — RH IG WORKUP (INCLUDES ABO/RH)
ABO/RH(D): A NEG
Fetal Screen: NEGATIVE
GESTATIONAL AGE(WKS): 28
Unit division: 0

## 2017-10-16 SURGERY — Surgical Case
Anesthesia: Regional

## 2017-10-16 MED ORDER — NALBUPHINE HCL 10 MG/ML IJ SOLN: 5.0000 mg | INTRAMUSCULAR | Status: DC | PRN

## 2017-10-16 MED ORDER — NALBUPHINE HCL 10 MG/ML IJ SOLN: 5.0000 mg | Freq: Once | INTRAMUSCULAR | Status: DC | PRN

## 2017-10-16 MED ORDER — SIMETHICONE 80 MG PO CHEW
80.0000 mg | CHEWABLE_TABLET | ORAL | Status: DC
  Administered 2017-10-18 – 2017-10-20 (×3): 80 mg via ORAL
  Filled 2017-10-16 (×3): qty 1

## 2017-10-16 MED ORDER — OXYCODONE-ACETAMINOPHEN 5-325 MG PO TABS
1.0000 | ORAL_TABLET | ORAL | Status: DC | PRN
  Administered 2017-10-17 (×2): 1 via ORAL
  Filled 2017-10-16 (×2): qty 1

## 2017-10-16 MED ORDER — OXYTOCIN 10 UNIT/ML IJ SOLN
INTRAMUSCULAR | Status: AC
  Filled 2017-10-16: qty 4

## 2017-10-16 MED ORDER — ONDANSETRON HCL 4 MG/2ML IJ SOLN
INTRAMUSCULAR | Status: AC
  Filled 2017-10-16: qty 2

## 2017-10-16 MED ORDER — NALOXONE HCL 0.4 MG/ML IJ SOLN: 0.4000 mg | INTRAMUSCULAR | Status: DC | PRN

## 2017-10-16 MED ORDER — OXYCODONE HCL 5 MG PO TABS: 5.0000 mg | ORAL_TABLET | Freq: Once | ORAL | Status: DC | PRN

## 2017-10-16 MED ORDER — ZOLPIDEM TARTRATE 5 MG PO TABS: 5.0000 mg | ORAL_TABLET | Freq: Every evening | ORAL | Status: DC | PRN

## 2017-10-16 MED ORDER — OXYTOCIN 10 UNIT/ML IJ SOLN
INTRAVENOUS | Status: DC | PRN
  Administered 2017-10-16: 40 [IU] via INTRAVENOUS

## 2017-10-16 MED ORDER — COCONUT OIL OIL: 1.0000 "application " | TOPICAL_OIL | Status: DC | PRN

## 2017-10-16 MED ORDER — DEXAMETHASONE SODIUM PHOSPHATE 4 MG/ML IJ SOLN
INTRAMUSCULAR | Status: AC
  Filled 2017-10-16: qty 1

## 2017-10-16 MED ORDER — SENNOSIDES-DOCUSATE SODIUM 8.6-50 MG PO TABS
2.0000 | ORAL_TABLET | ORAL | Status: DC
  Administered 2017-10-18 – 2017-10-20 (×3): 2 via ORAL
  Filled 2017-10-16 (×3): qty 2

## 2017-10-16 MED ORDER — SIMETHICONE 80 MG PO CHEW
80.0000 mg | CHEWABLE_TABLET | Freq: Three times a day (TID) | ORAL | Status: DC
  Administered 2017-10-17 – 2017-10-21 (×14): 80 mg via ORAL
  Filled 2017-10-16 (×14): qty 1

## 2017-10-16 MED ORDER — SODIUM CHLORIDE 0.9 % IR SOLN
Status: DC | PRN
  Administered 2017-10-16: 650 mL

## 2017-10-16 MED ORDER — SCOPOLAMINE 1 MG/3DAYS TD PT72: 1.0000 | MEDICATED_PATCH | Freq: Once | TRANSDERMAL | Status: DC

## 2017-10-16 MED ORDER — SODIUM CHLORIDE 0.9 % IR SOLN
Status: DC | PRN
  Administered 2017-10-16: 1000 mL

## 2017-10-16 MED ORDER — OXYTOCIN 40 UNITS IN LACTATED RINGERS INFUSION - SIMPLE MED: 2.5000 [IU]/h | INTRAVENOUS | Status: AC

## 2017-10-16 MED ORDER — PHENYLEPHRINE HCL 10 MG/ML IJ SOLN
INTRAMUSCULAR | Status: DC | PRN
  Administered 2017-10-16 (×3): 80 ug via INTRAVENOUS

## 2017-10-16 MED ORDER — CITALOPRAM HYDROBROMIDE 10 MG PO TABS
10.0000 mg | ORAL_TABLET | Freq: Every day | ORAL | Status: DC
  Administered 2017-10-17 – 2017-10-21 (×5): 10 mg via ORAL
  Filled 2017-10-16 (×6): qty 1

## 2017-10-16 MED ORDER — ONDANSETRON HCL 4 MG/2ML IJ SOLN: 4.0000 mg | Freq: Three times a day (TID) | INTRAMUSCULAR | Status: DC | PRN

## 2017-10-16 MED ORDER — SCOPOLAMINE 1 MG/3DAYS TD PT72
MEDICATED_PATCH | TRANSDERMAL | Status: DC | PRN
  Administered 2017-10-16: 1 via TRANSDERMAL

## 2017-10-16 MED ORDER — IBUPROFEN 600 MG PO TABS
600.0000 mg | ORAL_TABLET | Freq: Four times a day (QID) | ORAL | Status: DC
  Administered 2017-10-17 – 2017-10-21 (×18): 600 mg via ORAL
  Filled 2017-10-16 (×18): qty 1

## 2017-10-16 MED ORDER — GENTAMICIN SULFATE 40 MG/ML IJ SOLN
Freq: Once | INTRAVENOUS | Status: AC
  Administered 2017-10-16: 117 mL via INTRAVENOUS
  Filled 2017-10-16: qty 11

## 2017-10-16 MED ORDER — WITCH HAZEL-GLYCERIN EX PADS: 1.0000 "application " | MEDICATED_PAD | CUTANEOUS | Status: DC | PRN

## 2017-10-16 MED ORDER — DEXAMETHASONE SODIUM PHOSPHATE 4 MG/ML IJ SOLN
INTRAMUSCULAR | Status: DC | PRN
  Administered 2017-10-16: 4 mg via INTRAVENOUS

## 2017-10-16 MED ORDER — HYDROMORPHONE HCL 1 MG/ML IJ SOLN: 0.2500 mg | INTRAMUSCULAR | Status: DC | PRN

## 2017-10-16 MED ORDER — PHENYLEPHRINE 8 MG IN D5W 100 ML (0.08MG/ML) PREMIX OPTIME
INJECTION | INTRAVENOUS | Status: AC
  Filled 2017-10-16: qty 100

## 2017-10-16 MED ORDER — PHENYLEPHRINE 8 MG IN D5W 100 ML (0.08MG/ML) PREMIX OPTIME
INJECTION | INTRAVENOUS | Status: DC | PRN
  Administered 2017-10-16: 60 ug/min via INTRAVENOUS

## 2017-10-16 MED ORDER — ONDANSETRON HCL 4 MG/2ML IJ SOLN
INTRAMUSCULAR | Status: DC | PRN
  Administered 2017-10-16: 4 mg via INTRAVENOUS

## 2017-10-16 MED ORDER — MEPERIDINE HCL 25 MG/ML IJ SOLN: 6.2500 mg | INTRAMUSCULAR | Status: DC | PRN

## 2017-10-16 MED ORDER — SODIUM CHLORIDE 0.9% FLUSH: 3.0000 mL | INTRAVENOUS | Status: DC | PRN

## 2017-10-16 MED ORDER — NALOXONE HCL 4 MG/10ML IJ SOLN
1.0000 ug/kg/h | INTRAVENOUS | Status: DC | PRN
  Filled 2017-10-16: qty 5

## 2017-10-16 MED ORDER — SIMETHICONE 80 MG PO CHEW: 80.0000 mg | CHEWABLE_TABLET | ORAL | Status: DC | PRN

## 2017-10-16 MED ORDER — BUPIVACAINE HCL (PF) 0.25 % IJ SOLN
INTRAMUSCULAR | Status: DC | PRN
  Administered 2017-10-16: 10 mL

## 2017-10-16 MED ORDER — BUPIVACAINE HCL (PF) 0.25 % IJ SOLN
INTRAMUSCULAR | Status: AC
  Filled 2017-10-16: qty 30

## 2017-10-16 MED ORDER — PRENATAL MULTIVITAMIN CH
1.0000 | ORAL_TABLET | Freq: Every day | ORAL | Status: DC
  Administered 2017-10-17 – 2017-10-21 (×5): 1 via ORAL
  Filled 2017-10-16 (×5): qty 1

## 2017-10-16 MED ORDER — CLINDAMYCIN PHOSPHATE 900 MG/50ML IV SOLN: 900.0000 mg | Freq: Three times a day (TID) | INTRAVENOUS | Status: DC

## 2017-10-16 MED ORDER — DIPHENHYDRAMINE HCL 25 MG PO CAPS
25.0000 mg | ORAL_CAPSULE | ORAL | Status: DC | PRN
  Filled 2017-10-16: qty 1

## 2017-10-16 MED ORDER — SOD CITRATE-CITRIC ACID 500-334 MG/5ML PO SOLN
ORAL | Status: AC
  Administered 2017-10-16: 20:00:00
  Filled 2017-10-16: qty 15

## 2017-10-16 MED ORDER — MORPHINE SULFATE (PF) 0.5 MG/ML IJ SOLN
INTRAMUSCULAR | Status: DC | PRN
  Administered 2017-10-16: 1 mg via EPIDURAL
  Administered 2017-10-16: .2 mg via EPIDURAL
  Administered 2017-10-16: 1 mg via EPIDURAL
  Administered 2017-10-16: .3 mg via EPIDURAL

## 2017-10-16 MED ORDER — DIPHENHYDRAMINE HCL 50 MG/ML IJ SOLN: 12.5000 mg | INTRAMUSCULAR | Status: DC | PRN

## 2017-10-16 MED ORDER — KETOROLAC TROMETHAMINE 30 MG/ML IJ SOLN: 30.0000 mg | Freq: Once | INTRAMUSCULAR | Status: DC | PRN

## 2017-10-16 MED ORDER — DIPHENHYDRAMINE HCL 50 MG/ML IJ SOLN
INTRAMUSCULAR | Status: AC
  Filled 2017-10-16: qty 1

## 2017-10-16 MED ORDER — OXYCODONE HCL 5 MG/5ML PO SOLN: 5.0000 mg | Freq: Once | ORAL | Status: DC | PRN

## 2017-10-16 MED ORDER — OXYCODONE-ACETAMINOPHEN 5-325 MG PO TABS: 2.0000 | ORAL_TABLET | ORAL | Status: DC | PRN

## 2017-10-16 MED ORDER — MENTHOL 3 MG MT LOZG: 1.0000 | LOZENGE | OROMUCOSAL | Status: DC | PRN

## 2017-10-16 MED ORDER — BUPIVACAINE IN DEXTROSE 0.75-8.25 % IT SOLN
INTRATHECAL | Status: DC | PRN
  Administered 2017-10-16: 1.6 mL via INTRATHECAL

## 2017-10-16 MED ORDER — CLINDAMYCIN PHOSPHATE 900 MG/50ML IV SOLN
900.0000 mg | Freq: Three times a day (TID) | INTRAVENOUS | Status: AC
  Administered 2017-10-17 (×3): 900 mg via INTRAVENOUS
  Filled 2017-10-16 (×3): qty 50

## 2017-10-16 MED ORDER — PROMETHAZINE HCL 25 MG/ML IJ SOLN: 6.2500 mg | INTRAMUSCULAR | Status: DC | PRN

## 2017-10-16 MED ORDER — TETANUS-DIPHTH-ACELL PERTUSSIS 5-2.5-18.5 LF-MCG/0.5 IM SUSP
0.5000 mL | Freq: Once | INTRAMUSCULAR | Status: AC
  Administered 2017-10-17: 0.5 mL via INTRAMUSCULAR
  Filled 2017-10-16: qty 0.5

## 2017-10-16 MED ORDER — DIPHENHYDRAMINE HCL 25 MG PO CAPS: 25.0000 mg | ORAL_CAPSULE | Freq: Four times a day (QID) | ORAL | Status: DC | PRN

## 2017-10-16 MED ORDER — MORPHINE SULFATE (PF) 0.5 MG/ML IJ SOLN
INTRAMUSCULAR | Status: AC
  Filled 2017-10-16: qty 10

## 2017-10-16 MED ORDER — DIBUCAINE 1 % RE OINT: 1.0000 "application " | TOPICAL_OINTMENT | RECTAL | Status: DC | PRN

## 2017-10-16 MED ORDER — SCOPOLAMINE 1 MG/3DAYS TD PT72
MEDICATED_PATCH | TRANSDERMAL | Status: AC
  Filled 2017-10-16: qty 1

## 2017-10-16 MED ORDER — LACTATED RINGERS IV BOLUS (SEPSIS)
1000.0000 mL | Freq: Once | INTRAVENOUS | Status: AC
  Administered 2017-10-16: 1000 mL via INTRAVENOUS

## 2017-10-16 MED ORDER — LACTATED RINGERS IV SOLN
INTRAVENOUS | Status: DC
  Administered 2017-10-17: 07:00:00 via INTRAVENOUS

## 2017-10-16 SURGICAL SUPPLY — 47 items
BARRIER ADHS 3X4 INTERCEED (GAUZE/BANDAGES/DRESSINGS) ×6 IMPLANT
BENZOIN TINCTURE PRP APPL 2/3 (GAUZE/BANDAGES/DRESSINGS) IMPLANT
CHLORAPREP W/TINT 26ML (MISCELLANEOUS) ×3 IMPLANT
CLAMP CORD UMBIL (MISCELLANEOUS) IMPLANT
CLOSURE WOUND 1/2 X4 (GAUZE/BANDAGES/DRESSINGS)
CLOTH BEACON ORANGE TIMEOUT ST (SAFETY) ×3 IMPLANT
DECANTER SPIKE VIAL GLASS SM (MISCELLANEOUS) ×3 IMPLANT
DRAPE C SECTION CLR SCREEN (DRAPES) ×3 IMPLANT
DRSG OPSITE POSTOP 4X10 (GAUZE/BANDAGES/DRESSINGS) ×3 IMPLANT
ELECT REM PT RETURN 9FT ADLT (ELECTROSURGICAL) ×3
ELECTRODE REM PT RTRN 9FT ADLT (ELECTROSURGICAL) ×1 IMPLANT
EXTRACTOR VACUUM M CUP 4 TUBE (SUCTIONS) IMPLANT
EXTRACTOR VACUUM M CUP 4' TUBE (SUCTIONS)
GLOVE BIOGEL PI IND STRL 7.0 (GLOVE) ×2 IMPLANT
GLOVE BIOGEL PI INDICATOR 7.0 (GLOVE) ×4
GLOVE ECLIPSE 6.5 STRL STRAW (GLOVE) ×3 IMPLANT
GOWN STRL REUS W/TWL LRG LVL3 (GOWN DISPOSABLE) ×6 IMPLANT
HOVERMATT SINGLE USE (MISCELLANEOUS) ×3 IMPLANT
KIT ABG SYR 3ML LUER SLIP (SYRINGE) IMPLANT
NEEDLE HYPO 22GX1.5 SAFETY (NEEDLE) ×3 IMPLANT
NEEDLE HYPO 25X5/8 SAFETYGLIDE (NEEDLE) IMPLANT
NS IRRIG 1000ML POUR BTL (IV SOLUTION) ×3 IMPLANT
PACK C SECTION WH (CUSTOM PROCEDURE TRAY) ×3 IMPLANT
PAD OB MATERNITY 4.3X12.25 (PERSONAL CARE ITEMS) ×3 IMPLANT
RETRACTOR TRAXI PANNICULUS (MISCELLANEOUS) ×1 IMPLANT
RTRCTR C-SECT PINK 25CM LRG (MISCELLANEOUS) IMPLANT
STRIP CLOSURE SKIN 1/2X4 (GAUZE/BANDAGES/DRESSINGS) IMPLANT
SUT CHROMIC GUT AB #0 18 (SUTURE) IMPLANT
SUT MNCRL 0 VIOLET CTX 36 (SUTURE) ×3 IMPLANT
SUT MON AB 2-0 SH 27 (SUTURE)
SUT MON AB 2-0 SH27 (SUTURE) IMPLANT
SUT MON AB 3-0 SH 27 (SUTURE)
SUT MON AB 3-0 SH27 (SUTURE) IMPLANT
SUT MON AB 4-0 PS1 27 (SUTURE) IMPLANT
SUT MONOCRYL 0 CTX 36 (SUTURE) ×6
SUT PLAIN 2 0 (SUTURE)
SUT PLAIN 2 0 XLH (SUTURE) ×3 IMPLANT
SUT PLAIN ABS 2-0 CT1 27XMFL (SUTURE) IMPLANT
SUT VIC AB 0 CT1 36 (SUTURE) ×6 IMPLANT
SUT VIC AB 2-0 CT1 27 (SUTURE) ×2
SUT VIC AB 2-0 CT1 TAPERPNT 27 (SUTURE) ×1 IMPLANT
SUT VIC AB 4-0 KS 27 (SUTURE) ×3 IMPLANT
SUT VIC AB 4-0 PS2 27 (SUTURE) IMPLANT
SYR CONTROL 10ML LL (SYRINGE) ×3 IMPLANT
TOWEL OR 17X24 6PK STRL BLUE (TOWEL DISPOSABLE) ×3 IMPLANT
TRAXI PANNICULUS RETRACTOR (MISCELLANEOUS) ×2
TRAY FOLEY BAG SILVER LF 14FR (SET/KITS/TRAYS/PACK) IMPLANT

## 2017-10-16 NOTE — Anesthesia Preprocedure Evaluation (Signed)
Anesthesia Evaluation  Patient identified by MRN, date of birth, ID band Patient awake    Reviewed: Allergy & Precautions, NPO status , Patient's Chart, lab work & pertinent test results  Airway Mallampati: III  TM Distance: >3 FB Neck ROM: Full    Dental no notable dental hx.    Pulmonary neg pulmonary ROS,    Pulmonary exam normal breath sounds clear to auscultation       Cardiovascular negative cardio ROS Normal cardiovascular exam Rhythm:Regular Rate:Normal     Neuro/Psych negative neurological ROS  negative psych ROS   GI/Hepatic negative GI ROS, Neg liver ROS,   Endo/Other  diabetesMorbid obesity  Renal/GU negative Renal ROS     Musculoskeletal negative musculoskeletal ROS (+)   Abdominal (+) + obese,   Peds  Hematology negative hematology ROS (+)   Anesthesia Other Findings   Reproductive/Obstetrics (+) Pregnancy                             Anesthesia Physical Anesthesia Plan  ASA: III and emergent  Anesthesia Plan:    Post-op Pain Management:    Induction:   PONV Risk Score and Plan: 4 or greater and Ondansetron, Scopolamine patch - Pre-op and Treatment may vary due to age or medical condition  Airway Management Planned:   Additional Equipment: None  Intra-op Plan:   Post-operative Plan:   Informed Consent: I have reviewed the patients History and Physical, chart, labs and discussed the procedure including the risks, benefits and alternatives for the proposed anesthesia with the patient or authorized representative who has indicated his/her understanding and acceptance.   Dental advisory given  Plan Discussed with: CRNA  Anesthesia Plan Comments:         Anesthesia Quick Evaluation

## 2017-10-16 NOTE — Progress Notes (Signed)
Called by RN regarding severe variable decelerations( repetitive).  Baseline 165 decelerations down to 90's No ctx noted (+) accels BP (!) 148/74 (BP Location: Right Arm)   Pulse 84   Temp 98.4 F (36.9 C) (Oral)   Resp 18   Ht 5\' 3"  (1.6 m)   Wt (!) 142 kg (313 lb 0.6 oz)   SpO2 100%   BMI 55.45 kg/m  Given the tracing: will proceed with C/S. Pt notified. Risk of surgery reviewed including infection, bleeding, injury to bladder, bowels, ureter, poss need for C/S in the future, poss need for blood transfusion and its risk. All ? Answered. OR , NICU, notified. Consent signed

## 2017-10-16 NOTE — Progress Notes (Signed)
S: c/o cramping in vagina Denies abdominal pain Leaking fluid  O:BP 137/78 (BP Location: Right Arm)   Pulse 99   Temp 97.9 F (36.6 C) (Oral)   Resp 18   Ht 5\' 3"  (1.6 m)   Wt (!) 142 kg (313 lb 0.6 oz)   SpO2 99%   BMI 55.45 kg/m  Lungs clear to A Cor RRR Abd obese soft non tender Pelvic : SSE closed scant pink Digital: closed/long/-3 Extr: no edema GBS cx neg Tracing; baseline 175 (+) accels  Good variability No ctx GBS cx neg CBG (last 3)  Recent Labs    10/15/17 2236 10/16/17 0552 10/16/17 1200  GLUCAP 103* 77 96     IMP: PPROM on Azithromycin D3 IUP@28  1/7 weeks RH negative.Marland Kitchen. Rhophylac Class A2 GDM on metformin. BMZ complete Fetal tachycardia w/o evidence of PTL or chorioamnionitis P) cbc today. Cont tracing. Cont antibiotics. Watch closely

## 2017-10-16 NOTE — Transfer of Care (Signed)
Immediate Anesthesia Transfer of Care Note  Patient: Tara Porter  Procedure(s) Performed: CESAREAN SECTION (N/A )  Patient Location: PACU  Anesthesia Type:Epidural  Level of Consciousness: awake, alert  and oriented  Airway & Oxygen Therapy: Patient Spontanous Breathing  Post-op Assessment: Report given to RN and Post -op Vital signs reviewed and stable  Post vital signs: Reviewed and stable  Last Vitals:  Vitals:   10/16/17 1217 10/16/17 1632  BP: 137/78 (!) 148/74  Pulse: 99 84  Resp: 18 18  Temp:  36.9 C  SpO2: 99% 100%    Last Pain:  Vitals:   10/16/17 1632  TempSrc: Oral  PainSc:       Patients Stated Pain Goal: 2 (10/16/17 1304)  Complications: No apparent anesthesia complications

## 2017-10-16 NOTE — Anesthesia Procedure Notes (Signed)
Spinal  Patient location during procedure: OR Staffing Anesthesiologist: Nolon Nations, MD Performed: anesthesiologist  Preanesthetic Checklist Completed: patient identified, site marked, surgical consent, pre-op evaluation, timeout performed, IV checked, risks and benefits discussed and monitors and equipment checked Spinal Block Patient position: sitting Prep: site prepped and draped and DuraPrep Patient monitoring: heart rate, continuous pulse ox and blood pressure Approach: midline Location: L3-4 Injection technique: single-shot Needle Needle type: Sprotte  Needle gauge: 24 G Needle length: 9 cm Assessment Sensory level: T8 Additional Notes Expiration date of kit checked and confirmed. Patient tolerated procedure well, without complications.

## 2017-10-16 NOTE — Brief Op Note (Signed)
10/16/2017  9:18 PM  PATIENT:  Tara Porter  22 y.o. female  PRE-OPERATIVE DIAGNOSIS:  Repetitive deep variable decelerations, PTL, PPROM, IUP @ 28 1/7 weeks  POST-OPERATIVE DIAGNOSIS:  PTL, chorioamnionitis, PPROM, IUP@ 28 1/7 weeks, Repetitive deep variable decelerations  PROCEDURE:  Primary Cesarean section, low transverse hysterotomy  SURGEON:  Surgeon(s) and Role:    * Maxie Betterousins, Ziyah Cordoba, MD - Primary  PHYSICIAN ASSISTANT:   ASSISTANTS: Arlan Organaniela Paul, CNM   ANESTHESIA:   spinal Findings: just prior to prepping, VE 4/100/-3. Live female with abnormal cranium, foul amniotic fluid, nl tubes and ovaries, ratty placenta( complete), left tube with thrombosed paratubal cyst removed EBL:  522 mL   BLOOD ADMINISTERED:none  DRAINS: none   LOCAL MEDICATIONS USED:  MARCAINE     SPECIMEN:  Source of Specimen:  placenta  DISPOSITION OF SPECIMEN:  PATHOLOGY  COUNTS:  YES  TOURNIQUET:  * No tourniquets in log *  DICTATION: .Other Dictation: Dictation Number [id number]  PLAN OF CARE: Admit to inpatient   PATIENT DISPOSITION:  PACU - hemodynamically stable.   Delay start of Pharmacological VTE agent (>24hrs) due to surgical blood loss or risk of bleeding: no

## 2017-10-17 ENCOUNTER — Encounter (HOSPITAL_COMMUNITY): Payer: Self-pay | Admitting: Obstetrics and Gynecology

## 2017-10-17 LAB — CBC
HCT: 31.1 % — ABNORMAL LOW (ref 36.0–46.0)
Hemoglobin: 10.3 g/dL — ABNORMAL LOW (ref 12.0–15.0)
MCH: 27.1 pg (ref 26.0–34.0)
MCHC: 33.1 g/dL (ref 30.0–36.0)
MCV: 81.8 fL (ref 78.0–100.0)
Platelets: 341 10*3/uL (ref 150–400)
RBC: 3.8 MIL/uL — AB (ref 3.87–5.11)
RDW: 14.3 % (ref 11.5–15.5)
WBC: 26.4 10*3/uL — AB (ref 4.0–10.5)

## 2017-10-17 LAB — GLUCOSE, CAPILLARY: GLUCOSE-CAPILLARY: 84 mg/dL (ref 65–99)

## 2017-10-17 NOTE — Plan of Care (Signed)
  Progressing Coping: Level of anxiety will decrease 10/17/2017 0041 - Progressing by Pietro CassisHollis, Caralee Morea A, RN Clinical Measurements: Cardiovascular complication will be avoided 10/17/2017 0041 - Progressing by Pietro CassisHollis, Belynda Pagaduan A, RN Nutrition: Adequate nutrition will be maintained 10/17/2017 0041 - Progressing by Pietro CassisHollis, Tonnette Zwiebel A, RN Coping: Level of anxiety will decrease 10/17/2017 0041 - Progressing by Pietro CassisHollis, Tonnia Bardin A, RN Life Cycle: Chance of risk for complications during the postpartum period will decrease 10/17/2017 0041 - Progressing by Pietro CassisHollis, Yaneli Keithley A, RN Skin Integrity: Demonstration of wound healing without infection will improve 10/17/2017 0041 - Progressing by Pietro CassisHollis, Shivam Mestas A, RN

## 2017-10-17 NOTE — Op Note (Signed)
NAME:  Tara Porter, Tara Porter NO.:  [id number]  MEDICAL RECORD NO.:  [id number]  LOCATION:  9306                          FACILITY:  [facility]  PHYSICIAN:  Maxie Better, M.D.DATE OF BIRTH:  24-Jan-1996  DATE OF PROCEDURE:  10/16/2017 DATE OF DISCHARGE:                              OPERATIVE REPORT   PREOPERATIVE DIAGNOSIS:  Preterm premature rupture of membranes, nonreassuring fetal tracing, preterm labor, intrauterine gestation at 54 and 1/7 weeks.  POSTOPERATIVE DIAGNOSIS:  Preterm premature rupture of membranes, nonreassuring fetal tracing, preterm labor, intrauterine gestation at 9 and 1/7 weeks.  PROCEDURE:  Primary cesarean section, Kerr hysterotomy.  ANESTHESIA:  Spinal.  SURGEON:  Maxie Better, M.D.  ASSISTANT:  Arlan Organ, CNM.  DESCRIPTION OF PROCEDURE:  Under adequate spinal anesthesia, the patient was placed in supine position with a left lateral tilt.  Examination reveals a cervix that was 4 cm, 100%, -3 station.  The patient was sterilely prepped and draped in usual fashion and Foley catheter was sterilely placed.  A 0.25% Marcaine was injected at the planned Pfannenstiel skin incision site.  Pfannenstiel skin incision was then carried down to the rectus fascia.  Rectus fascia was opened transversely.  The rectus fascia was then bluntly and sharply dissected off the rectus muscle in a superior and inferior fashion.  The rectus muscles split in the midline.  The parietal peritoneum was entered bluntly and extended.  A self-retaining Alexis retractor was placed. The vesicouterine peritoneum was opened transversely.  A transverse incision was then made, extended with bandage scissors.  Subsequent delivery of amniotic sac with vertex presentation of a live female along with the partially detaching placenta was accomplished.  The placenta was subsequently removed entirely.  The cord was quickly clamped, cut, and the baby was transferred to the  awaiting pediatricians.  Apgars were 7 and 8 at one and 5 minutes.  Placenta, which was manually removed, cleaning of debris of the uterine cavity was done.  Uterine incision had no extension, was closed in 2 layers, the first layer with 0 Monocryl running lock stitch.  Second layer was imbricated with 0 Monocryl suture.  Figure-of-eight sutures placement on the left angle was resulting in good hemostasis.  Normal right tube and ovaries were noted. The left tube had 2 thrombosed paratubal cysts with thin adhesion twisting them together, were separated and lysed and removed.  The left ovary was normal. Abdomen was irrigated and suctioned of debris.  Interceed was placed in the overlying lower uterine segment.  The Alexis retractor was removed. The parietal peritoneum was closed with 2-0 Vicryl.  The rectus fascia was closed with 0 Vicryl x2.  The subcutaneous area was irrigated, small bleeders were cauterized, and the skin was closed with 4-0 Vicryl subcuticular closure.  A PICO dressing was placed.  SPECIMEN:  Placenta, sent to Pathology.  ESTIMATED BLOOD LOSS:  554 mL.  INTRAOPERATIVE FLUIDS:  1200 mL.  URINE OUTPUT:  400 mL.  SPONGE AND INSTRUMENT COUNT:  Counts x2 were correct.  COMPLICATIONS:  None.  Due to the malodorous fluid noted at the time of delivery of the baby, we will continue antibiotics.  The baby was transferred to the Neonatal Intensive  Care Unit.     Maxie BetterSheronette Calder Oblinger, M.D.     Hendry/MEDQ  D:  10/17/2017  T:  10/17/2017  Job:  [id number]

## 2017-10-17 NOTE — Progress Notes (Signed)
Subjective: S/P PC/S, 28 wks PPROM, Chorion, NRFHT POD# 1 Information for the patient's newborn:  Tara Porter, Girl Tara Porter [[id number]][[id number]]  female  Baby name: Tara Porter  Reports feeling well, getting ready to walk to NICU. Feeding: breast Patient reports tolerating PO.  Breast symptoms: pumping Pain controlled with PO meds Denies HA/SOB/C/P/N/V/dizziness. Flatus present, + BM. She reports vaginal bleeding as normal, without clots.  She is ambulating, urinating without difficulty.     Objective:   VS:    Vitals:   10/17/17 0532 10/17/17 0605 10/17/17 0650 10/17/17 0812  BP: 104/75   116/64  Pulse: (!) 137   89  Resp: 18   20  Temp:    97.7 F (36.5 C)  TempSrc:    Oral  SpO2: 100% 97% 98% 99%  Weight:      Height:          Intake/Output Summary (Last 24 hours) at 10/17/2017 1100 Last data filed at 10/17/2017 0800 Gross per 24 hour  Intake 1990 ml  Output 2247 ml  Net -257 ml        Recent Labs    10/16/17 1311 10/17/17 0506  WBC 21.6* 26.4*  HGB 10.7* 10.3*  HCT 33.7* 31.1*  PLT 374 341     Blood type: --/--/A NEG (03/03 1003)  Rubella:   immune    Physical Exam:  General: alert, cooperative and no distress Abdomen: soft, nontender, normal bowel sounds Incision: clean, dry, intact and PICO dressing Uterine Fundus:  Nontender, unable to palpate d/t maternal habitus Lochia: minimal Ext: edema trace, no cords or calf tenderness      Assessment/Plan: 22 y.o.   POD# 1. [id number]                  Principal Problem:   Cesarean delivery delivered: 28 wks, PPROM, Chorio, NR FHR 3/3 Active Problems:   Postpartum care following vaginal delivery Rh negative / newborn Rh neg also, no prophylaxis indicated  Doing well, stable.               Advance diet as tolerated Encourage rest when baby rests Breastfeeding support, pumping for NICU premature infant Encourage to ambulate Routine post-op care  Neta Mendsaniela C Bjorn Hallas, CNM, MSN 10/17/2017, 11:00 AM

## 2017-10-17 NOTE — Anesthesia Postprocedure Evaluation (Signed)
Anesthesia Post Note  Patient: Marlin CanaryKatherine Horine  Procedure(s) Performed: CESAREAN SECTION (N/A )     Patient location during evaluation: Mother Baby Anesthesia Type: Spinal Level of consciousness: awake and alert and oriented Pain management: satisfactory to patient Vital Signs Assessment: post-procedure vital signs reviewed and stable Respiratory status: spontaneous breathing and nonlabored ventilation Cardiovascular status: stable Postop Assessment: no headache, no backache, patient able to bend at knees, no signs of nausea or vomiting and adequate PO intake Anesthetic complications: no    Last Vitals:  Vitals:   10/17/17 0650 10/17/17 0812  BP:  116/64  Pulse:  89  Resp:  20  Temp:  36.5 C  SpO2: 98% 99%    Last Pain:  Vitals:   10/17/17 0812  TempSrc: Oral  PainSc:    Pain Goal: Patients Stated Pain Goal: 2 (10/16/17 1304)               Madison HickmanGREGORY,Tiana Sivertson

## 2017-10-18 ENCOUNTER — Inpatient Hospital Stay (HOSPITAL_COMMUNITY): Payer: 59

## 2017-10-18 DIAGNOSIS — D649 Anemia, unspecified: Secondary | ICD-10-CM

## 2017-10-18 LAB — CBC WITH DIFFERENTIAL/PLATELET
BASOS ABS: 0 10*3/uL (ref 0.0–0.1)
Basophils Relative: 0 %
EOS ABS: 0.2 10*3/uL (ref 0.0–0.7)
Eosinophils Relative: 1 %
HCT: 32.1 % — ABNORMAL LOW (ref 36.0–46.0)
Hemoglobin: 10.5 g/dL — ABNORMAL LOW (ref 12.0–15.0)
Lymphocytes Relative: 6 %
Lymphs Abs: 1.1 10*3/uL (ref 0.7–4.0)
MCH: 26.9 pg (ref 26.0–34.0)
MCHC: 32.7 g/dL (ref 30.0–36.0)
MCV: 82.1 fL (ref 78.0–100.0)
Monocytes Absolute: 0.5 10*3/uL (ref 0.1–1.0)
Monocytes Relative: 3 %
Neutro Abs: 16.3 10*3/uL — ABNORMAL HIGH (ref 1.7–7.7)
Neutrophils Relative %: 90 %
PLATELETS: 411 10*3/uL — AB (ref 150–400)
RBC: 3.91 MIL/uL (ref 3.87–5.11)
RDW: 14.6 % (ref 11.5–15.5)
WBC: 18.1 10*3/uL — AB (ref 4.0–10.5)

## 2017-10-18 MED ORDER — ACETAMINOPHEN 325 MG PO TABS
650.0000 mg | ORAL_TABLET | ORAL | Status: DC | PRN
  Administered 2017-10-18 (×2): 650 mg via ORAL
  Filled 2017-10-18 (×2): qty 2

## 2017-10-18 MED ORDER — GENTAMICIN SULFATE 40 MG/ML IJ SOLN
Freq: Three times a day (TID) | INTRAVENOUS | Status: DC
  Administered 2017-10-18 – 2017-10-21 (×9): via INTRAVENOUS
  Filled 2017-10-18 (×9): qty 4.75

## 2017-10-18 MED ORDER — OXYCODONE HCL 5 MG PO TABS
10.0000 mg | ORAL_TABLET | ORAL | Status: DC | PRN
  Administered 2017-10-20 (×2): 10 mg via ORAL
  Filled 2017-10-18 (×2): qty 2

## 2017-10-18 MED ORDER — CLINDAMYCIN PHOSPHATE 900 MG/50ML IV SOLN: 900.0000 mg | Freq: Three times a day (TID) | INTRAVENOUS | Status: DC

## 2017-10-18 MED ORDER — ACETAMINOPHEN 500 MG PO TABS
1000.0000 mg | ORAL_TABLET | Freq: Four times a day (QID) | ORAL | Status: DC | PRN
  Administered 2017-10-18: 1000 mg via ORAL
  Filled 2017-10-18: qty 2

## 2017-10-18 MED ORDER — OXYCODONE HCL 5 MG PO TABS
5.0000 mg | ORAL_TABLET | ORAL | Status: DC | PRN
  Administered 2017-10-18 – 2017-10-21 (×10): 5 mg via ORAL
  Filled 2017-10-18 (×10): qty 1

## 2017-10-18 NOTE — Progress Notes (Signed)
POSTOPERATIVE DAY # 2 S/P Primary LTCS at 28 weeks for PPROM, Chorioamnionitis, NRFHR, baby girl "[place]" in NICU   S:         Reports feeling okay; feels like fever is breaking - reports extreme sweating  Notes incisional pain/burning and discomfort with movement. Does note some slight burning with urination and after stream.  Hx. Of UTI. Denies abnormal vaginal discharge or odors   Denies URI/flu symptoms   Denies HA, vision changes, RUQ/epigastric pain              Tolerating po intake / no nausea / no vomiting / + flatus / no BM  Denies dizziness, SOB, or CP             Bleeding is moderate; a few small clots today             Pain controlled with Motrin, Tylenol, Oxycodone IR             Up ad lib / ambulatory/ voiding QS  Newborn in NICU - reports she is doing well; has been attempting pumping with no colostrum    O:  VS: BP 117/64 (BP Location: Left Arm)   Pulse 100   Temp (!) 101.6 F (38.7 C) (Oral)   Resp 20   Ht 5\' 3"  (1.6 m)   Wt (!) 142 kg (313 lb 0.6 oz)   SpO2 98%   Breastfeeding? Unknown   BMI 55.45 kg/m    LABS:               Recent Labs    10/16/17 1311 10/17/17 0506  WBC 21.6* 26.4*  HGB 10.7* 10.3*  PLT 374 341               Bloodtype: --/--/A NEG (03/03 1003)  Rubella:       Immune                                        I&O: Intake/Output      03/04 0701 - 03/05 0700 03/05 0701 - 03/06 0700   P.O. 480    I.V. (mL/kg)     IV Piggyback 50    Total Intake(mL/kg) 530 (3.7)    Urine (mL/kg/hr) 1000 (0.3)    Blood     Total Output 1000    Net -470                      Physical Exam:             Alert and Oriented X3  Lungs: Clear and unlabored  Heart: sinus tachycardia - 125 bpm on auscultation / no murmurs  Abdomen: soft, non-tender, non-distended; active bowel sounds              Fundus: firm, appropriate tenderness noted, difficult to palpate due to maternal body habitus             Dressing: PICO dressing clean/dry/intact   Incision:  approximated with sutures/ unable to visualize due to PICO dressing  Perineum: intact   Lochia: small, no clots   Extremities: +2 BLE edema, no calf pain or tenderness  A/P:    POD # 2 S/P Primary LTCS at 28 weeks for PPROM, Chorioamnionitis, NRFHR            RH Negative/ baby also RH negative    - no  Rhogam indicated  Mild ABL Anemia compounding maternal IDA - stable, asymptomatic   S/p Chorioamnionitis    - S/p Gentamicin and Clindamycin x 24 hours postpartum   Postpartum fever of unknown origin - Tmax 103F   - Repeat CBC with differential    - Tylenol 650mg  every 4 hours PRN   - Urine culture by straight cath   - Chest xray   - Blood cultures x 2            Routine postoperative care             Encouraged to shower today  See lactation today   Continue current care  Consult for plan: Dr. Nelle Donousins  Tara Billard, MSN, CNM [facility]

## 2017-10-18 NOTE — Progress Notes (Signed)
Interval note:   Notified by nursing that pt. Spiked another fever of 102F.  Tylenol 650mg  was given with resolution of fever.  Blood cultures and urine culture pending.  CXR negative.  Discussed with Dr. Cherly Hensenousins - recommend restarting Clindamycin 900 IVPB every 8 hours and Gentamicin per pharmacy consult.  Increase Tylenol to 1000mg  every 6 hours PRN, but do not exceed 4000mg /day.  RN updated with plan of care.  Keep SCDs on while pt in bed.   Carlean JewsMeredith Sigmon, CNM

## 2017-10-18 NOTE — Progress Notes (Signed)
Pharmacy Antibiotic Note  Tara Porter is a 22 y.o. female admitted on 10/12/2017 with preterm labor at 27 weeks.  Pt is now s/p LTCS on 3/4 delivery of 28 week with PPROM and chorioamnionitis.[facility] due to postpartum fever and Pencillin allergy.  Plan: Gentamicin 190mg  IV q8h  Height: 5\' 3"  (160 cm) Weight: (!) 313 lb 0.6 oz (142 kg) IBW/kg (Calculated) : 52.4  Adjusted/Dosing weight: 79.3kg  Temp (24hrs), Avg:100.4 F (38 C), Min:98.8 F (37.1 C), Max:103 F (39.4 C)  Recent Labs  Lab 10/12/17 0755 10/12/17 0841 10/16/17 1311 10/17/17 0506 10/18/17 0849  WBC 21.7*  --  21.6* 26.4* 18.1*  CREATININE  --  0.36* 0.40*  --   --     Estimated Creatinine Clearance: 152.3 mL/min (A) (by C-G formula based on SCr of 0.4 mg/dL (L)).    Allergies  Allergen Reactions  . Amoxicillin Hives    Has patient had a PCN reaction causing immediate rash, facial/tongue/throat swelling, SOB or lightheadedness with hypotension: no Has patient had a PCN reaction causing severe rash involving mucus membranes or skin necrosis: no Has patient had a PCN reaction that required hospitalizationno Has patient had a PCN reaction occurring within the last 10 years: no If all of the above answers are "NO", then may proceed with Cephalosporin use.      Antimicrobials this admission: Gentamicin 440mg  IV x 1 3/3 Clindamycin 900mg  IV q8h  3/3>> 3/4 Azithromycin 500mg  iv q24h  3/1>> 3/2   Microbiology results: 3/5 BCx: x 2 3/5 UCx:     Thank you for allowing pharmacy to be a part of this patient's care.  Claybon Jabsngel, Elzy Tomasello G 10/18/2017 6:05 PM

## 2017-10-19 LAB — CULTURE, OB URINE: CULTURE: NO GROWTH

## 2017-10-19 NOTE — Clinical Social Work Maternal (Signed)
Tara SOCIAL WORK MATERNAL/CHILD NOTE  Patient Details  Name: Tara Porter MRN: [id number] Date of Birth: 09/14/1995  Date:  01/11/18  Tara Social Worker Initiating Note:  Terri Piedra, [place] Date/Time: Initiated:  10/19/17/1030     Child's Name:  Tara Porter   Biological Parents:  Mother, Father(Tara Porter and Tara Porter, Tara (histocompatibility and immunogenetics))   Need for Interpreter:  None   Reason for Referral:  Parental Support of Premature Babies < 32 weeks/or Critically Ill babies   Address:  [place] [address]    Phone number:  [phone] (home)     Additional phone number:  Household Members/Support Persons (HM/SP):   Household Member/Support Person 1   HM/SP Name Relationship DOB or Age  HM/SP -1 Tara Porter FOB/fiance 24  HM/SP -2        HM/SP -3        HM/SP -4        HM/SP -5        HM/SP -6        HM/SP -7        HM/SP -8          Natural Supports (not living in the home):  Immediate Family, Extended Family(Parents noted that they are each other's main support.  They said they have younger siblings and FOB states his stepmom is somewhat supportive.  MOB states her mother and uncle are somewhat supportive.)   Professional Supports: Therapist(FOB sees Nelson Chimes at [facility] in [place] for therapy.  MOB is interested in starting therapy and was open to resources given by CSW.)   Employment:     Type of Work: MOB is not working.  FOB works in [employer] for [employer]:      Homebound arranged:    Museum/gallery curator Resources:  Private Insurance(MOB plans to apply for [employer] as a Consulting civil engineer and baby qualifies for [employer].)   Other Resources:      Cultural/Religious Considerations Which May Impact Care: MOB states she is Engineer, manufacturing.  FOB states he is Buddhist, but that his religion does not impact baby's care.  He states, "you do anything you need to do for her."  He states one of his coping mechanisms is chanting.    Strengths:   Ability to meet basic needs , Psychotropic Medications, Compliance with medical plan , Understanding of illness   Psychotropic Medications:  Celexa, Lamictal(MOB takes Celexa and FOB takes Lamictal)      Pediatrician:       Pediatrician List:   [place]      Pediatrician Fax Number:    Risk Factors/Current Problems:  Mental Health Concerns    Cognitive State:  Able to Concentrate , Alert , Linear Thinking , Goal Oriented , Insightful    Mood/Affect:  Calm , Comfortable , Interested    CSW Assessment: CSW met with parents in MOB's third floor room/306 to introduce services, offer support, and complete assessment due to baby's admission to NICU at 28.1 weeks.  Parents were pleasant and welcoming of CSW's visit.  CSW found them both to be easy to engage and equally involved in the conversation. Parents report that they had a miscarriage last April and initially found it hard to bond with baby in utero because of this.  MOB states that it didn't feel real to her until her  anatomy scan.  Both parents seem extremely happy about baby and about becoming parents.  It appears they have minimal supports, but seem very supportive of each other.  They seem to be sharing tasks of applying for benefits and making preparations for baby.  CSW informed them of baby's eligibility for [employer] Income and informed them of how to apply.  They are very interested.  MOB signed Patient Access form and CSW provided them with a copy of baby's admission summary. Parents report that baby is doing very well and acknowledged that not every day in the NICU will be easy.  They were open to processing their feelings and talking about their mental health concerns.  FOB reports that he struggled with the death of his father around the same time that they experienced the miscarriage last year.  He states March  13th will be the first anniversary of his father's death.  CSW encouraged him to give himself time to grieve, especially on this day.  He added, "and not gloss over it."  CSW agreed.  MOB states she thinks her day of discharge will be extremely hard and CSW validated this and encouraged them to allow themselves to be emotional.  CSW spoke about PMADs, monitoring emotions and coping mechanisms.  They were able to identify positive coping mechanisms and both state that they deal with mental health symptoms already.  MOB states she has anxiety and takes Celexa, which she resumed about 3 weeks ago.  She does not have a Social worker and is interested.  FOB was very encouraging of her to start counseling and states he loves talking with his counselor.  He states he was recently diagnosed with Bipolar and takes Lamictal.  CSW provided MOB with resources for [facility] in [place] and gave her the Psychology Today website where she can search for a provider.  Parents were appreciative.  They report feeling well emotionally at this time.  CSW asked them to call CSW any time they feel they would like to process their feelings surrounding baby's hospitalization or have questions they do not know who to talk to.  CSW asked them to call any time they would like to arrange a family conference as well as good communication is so important.  Parents stated understanding. They state their questions have been answered so far about what to expect from having a premature baby in the NICU.  We did not discuss pediatric follow up, SIDS precautions, or home preparations at this time.  CSW provided parents with contact information and is available for support and assistance as needed/desired by parents.  CSW Plan/Description:  Psychosocial Support and Ongoing Assessment of Needs, Perinatal Mood and Anxiety Disorder (PMADs) Education, Theatre stage manager Income (SSI) Information, Other Information/Referral to  [place], [place], [place] 2017-09-19, 4:04 PM

## 2017-10-20 LAB — TYPE AND SCREEN
ABO/RH(D): A NEG
Antibody Screen: POSITIVE
UNIT DIVISION: 0
Unit division: 0

## 2017-10-20 LAB — CBC WITH DIFFERENTIAL/PLATELET
BAND NEUTROPHILS: 0 %
BASOS ABS: 0 10*3/uL (ref 0.0–0.1)
BASOS PCT: 0 %
BLASTS: 0 %
EOS ABS: 0 10*3/uL (ref 0.0–0.7)
Eosinophils Relative: 0 %
HCT: 30.2 % — ABNORMAL LOW (ref 36.0–46.0)
Hemoglobin: 9.8 g/dL — ABNORMAL LOW (ref 12.0–15.0)
Lymphocytes Relative: 16 %
Lymphs Abs: 3 10*3/uL (ref 0.7–4.0)
MCH: 26.6 pg (ref 26.0–34.0)
MCHC: 32.5 g/dL (ref 30.0–36.0)
MCV: 82.1 fL (ref 78.0–100.0)
METAMYELOCYTES PCT: 0 %
MONO ABS: 0.6 10*3/uL (ref 0.1–1.0)
MONOS PCT: 3 %
Myelocytes: 0 %
Neutro Abs: 15.2 10*3/uL — ABNORMAL HIGH (ref 1.7–7.7)
Neutrophils Relative %: 81 %
Other: 0 %
Platelets: 404 10*3/uL — ABNORMAL HIGH (ref 150–400)
Promyelocytes Absolute: 0 %
RBC: 3.68 MIL/uL — ABNORMAL LOW (ref 3.87–5.11)
RDW: 14.5 % (ref 11.5–15.5)
WBC: 18.8 10*3/uL — ABNORMAL HIGH (ref 4.0–10.5)
nRBC: 0 /100 WBC

## 2017-10-20 LAB — BPAM RBC
Blood Product Expiration Date: 201903262359
Blood Product Expiration Date: 201903262359
UNIT TYPE AND RH: 600
Unit Type and Rh: 600

## 2017-10-20 MED ORDER — FERROUS SULFATE 325 (65 FE) MG PO TABS
325.0000 mg | ORAL_TABLET | Freq: Two times a day (BID) | ORAL | Status: DC
  Administered 2017-10-21: 325 mg via ORAL
  Filled 2017-10-20: qty 1

## 2017-10-20 NOTE — Plan of Care (Signed)
Continue discharge teaching

## 2017-10-20 NOTE — Progress Notes (Signed)
POD# 4 s/p PCS at 28 wks for chorio./  NRFHT  S: Pt notes still with abdominal pain,  minimal lochia, nl void, out of bed w/o dizziness or chest pain, tol reg po, + flatus.   Vitals:   10/19/17 2016 10/20/17 0041 10/20/17 0533 10/20/17 0814  BP:  116/64 134/64 128/62  Pulse: (!) 110 (!) 115 (!) 122 (!) 109  Resp: 20 19 20 18   Temp: 100 F (37.8 C) 98.7 F (37.1 C) 97.7 F (36.5 C) 98.9 F (37.2 C)  TempSrc:  Oral Oral   SpO2:  100% 98% 97%  Weight:      Height:      Tmax in past 24 hrs 99.5 at 12p on 3/6  Pt notes temp to 100 last night around midnight  Gen: well appearing, upset given recent discussion with neonatologist CV: RRR Pulm: CTAB Abd: obese, soft, ND, approp tender, fundus tender, below umbilicus Inc: C/D/I, wound vac in place LE: tr edema, NT  CBC    Component Value Date/Time   WBC 18.1 (H) 10/18/2017 0849   RBC 3.91 10/18/2017 0849   HGB 10.5 (L) 10/18/2017 0849   HCT 32.1 (L) 10/18/2017 0849   PLT 411 (H) 10/18/2017 0849   MCV 82.1 10/18/2017 0849   MCH 26.9 10/18/2017 0849   MCHC 32.7 10/18/2017 0849   RDW 14.6 10/18/2017 0849   LYMPHSABS 1.1 10/18/2017 0849   MONOABS 0.5 10/18/2017 0849   EOSABS 0.2 10/18/2017 0849   BASOSABS 0.0 10/18/2017 0849   CXR neg UCx neg Blood Cx prelim neg x 2days  A/P: POD#  1 s/p PCS - post-op. Doing well.  - chorio, recovering but still needs IV abx.  - tachycardia, will check CBC to eval for progressing anemia - baby in NICU, new diagnosis of craniosynostosis and planning outpt neurosurgeon consult  Lendon ColonelKelly A Aurelie Dicenzo 10/20/2017 12:38 PM

## 2017-10-20 NOTE — Lactation Note (Signed)
This note was copied from a baby's chart. Lactation Consultation Note  Patient Name: Tara Porter Date: 10/20/2017 Reason for consult: Initial assessment;1st time breastfeeding;Primapara;NICU baby;Infant < 6lbs;Preterm <34wks;Maternal endocrine disorder Type of Endocrine Disorder?: PCOS GDM  Visited with P1 Mom of 1859w1d baby in the NICU.  Baby 90 hrs old.  Mom has been double pumping on initiation setting every 3 hrs.  Encouraged breast massage and hand expression along with double pumping.  Mom has "snappies" and encouraged to bring drops to NICU for baby.   Encouraged Mom to pump following STS in the NICU.  Mom able to have baby STS this evening.   Mom asking questions about pumping rental at discharge, information given.  To inquire with her insurance about obtaining a DEBP for home use.  Mom to not be discharges due to being treated for a uterine infection.    Lactation and NICU brochures given.  Mom aware of IP and OP lactation services available to her.   Encouraged to ask for assistance prn.  Interventions Interventions: Breast feeding basics reviewed;DEBP;Expressed milk;Breast massage;Hand express  Lactation Tools Discussed/Used Tools: Pump Breast pump type: Double-Electric Breast Pump WIC Program: No Pump Review: Setup, frequency, and cleaning;Milk Storage Initiated by:: HROB RN Date initiated:: 10/20/17   Consult Status Consult Status: Follow-up Date: 10/21/17 Follow-up type: In-patient    [name], Conan Mcmanaway E 10/20/2017, 2:30 PM

## 2017-10-21 MED ORDER — FERROUS SULFATE 325 (65 FE) MG PO TABS: 325.0000 mg | ORAL_TABLET | Freq: Two times a day (BID) | ORAL | 3 refills | Status: DC

## 2017-10-21 MED ORDER — IBUPROFEN 600 MG PO TABS: 600.0000 mg | ORAL_TABLET | Freq: Four times a day (QID) | ORAL | 0 refills | Status: DC

## 2017-10-21 MED ORDER — OXYCODONE HCL 5 MG PO TABS: 5.0000 mg | ORAL_TABLET | ORAL | 0 refills | Status: DC | PRN

## 2017-10-21 NOTE — Progress Notes (Signed)
POSTOPERATIVE DAY # 5 S/P S/P Primary LTCS at 28 weeks for PPROM, Chorioamnionitis, NRFHR, baby girl "[place]" in NICU  S:         Reports feeling much better today and desires to be discharged home today  Reports abdominal tenderness is much improved rating it 3-4/10.              Tolerating po intake / no nausea / no vomiting / + flatus / + BM  Denies dizziness, SOB, or CP             Bleeding is light             Pain controlled with Motrin and Oxycodone IR             Up ad lib / ambulatory/ voiding QS  Newborn in NICU - new dx of craniosynostosis; pt. States she is coping well on Celexa; she is pumping colostrum    O:  VS: BP 123/77 (BP Location: Left Arm)   Pulse (!) 110   Temp 99 F (37.2 C)   Resp 18   Ht 5\' 3"  (1.6 m)   Wt (!) 142 kg (313 lb 0.6 oz)   SpO2 100%   Breastfeeding? Unknown   BMI 55.45 kg/m    LABS:               Recent Labs    10/20/17 1329  WBC 18.8*  HGB 9.8*  PLT 404*               Bloodtype: --/--/A NEG (03/03 1003)  Rubella:                                               I&O: Intake/Output      03/07 0701 - 03/08 0700 03/08 0701 - 03/09 0700   P.O. 240    Total Intake(mL/kg) 240 (1.7)    Urine (mL/kg/hr) 2100 (0.6)    Total Output 2100    Net -1860         Urine Occurrence 1 x                 Physical Exam:             Alert and Oriented X3  Lungs: Clear and unlabored  Heart: regular rate and rhythm / no murmurs  Abdomen: soft, non-tender, non-distended, active bowel sounds in all quadrants              Fundus: firm, non-tender today - unable to palpate fundus due to habitus              Dressing: PICO dressing c/d/i              Incision:  approximated with sutures / no erythema / no ecchymosis / no drainage  Perineum: intact  Lochia: scant  Extremities: trace pedal edema, no calf pain or tenderness,   A/P:        POD # 5 S/P Primary LTCS at 28 weeks for PPROM, Chorioamnionitis, NRFHR            RH Negative/ baby also RH negative                           - no Rhogam indicated  Mild ABL Anemia compounding maternal IDA - stable, asymptomatic              S/p Chorioamnionitis                          - S/p Gentamicin and Clindamycin x 24 hours postpartum              Postpartum fever r/t chorioamnionitis                              - S/p Gentamicin and Clindamycin for an additional 48 hours                         - Urine culture by straight cath - negative                         - Chest xray- negative                         - Blood cultures x 2 - negative           Routine postoperative care            Discharge home today            WOB booklet reviewed            Remove PICO dressing prior to discharge; keep steri-strips on for 1 week            Carlean Jews, MSN, CNM [facility]

## 2017-10-21 NOTE — Discharge Summary (Addendum)
Obstetric Discharge Summary   Patient Name: Tara Porter DOB: 02-May-1996 MRN: [id number]  Date of Admission: 10/12/2017 Date of Discharge: 10/21/2017 Date of Delivery: 10/16/2017 Gestational Age at Delivery: 5371w1d  Primary OB: [facility] - Dr. Cherly Hensenousins  Antepartum complications:  - Vaginal bleeding in second trimester - PPROM  - Chorioamnionitis  - PCOS  - UTI - Morbid Obesity  - Class A1GDM  - Anxiety on Celexa  - RH Negative  Prenatal Labs:  ABO, Rh:  A negative Antibody:  neg Rubella:  Immune RPR:   NR HBsAg:   Neg HIV:   neg GBS:   to be done   Admitting Diagnosis: Vaginal bleeding in 2nd trimester  Secondary Diagnoses: Patient Active Problem List   Diagnosis Date Noted  . Acute on chronic anemia 10/18/2017  . Cesarean delivery delivered: 28 wks, PPROM, Chorio, NR FHR 3/3 10/17/2017  . Postpartum care following cesarean delivery (3/3) 10/16/2017    Date of Delivery: 10/16/2017 Delivered By: Dr. Cherly Hensenousins/ D. Renae FicklePaul CNM assist Delivery Type: primary cesarean section, low transverse incision  Newborn Data: Live born female  Birth Weight: 2 lb 7.5 oz (1120 g) APGAR: 7, 8  Newborn Delivery   Birth date/time:  10/16/2017 20:19:00 Delivery type:  C-Section, low transverse C-section categorization:  Primary        Hospital/Postpartum Course  (Cesarean Section):  Patient presented with unexplained 2nd trimester vaginal bleeding and was admitted to antepartum.  She then PPROM'd and was started on latency antibiotics. Pt began having deep variable decelerations with fetal tachycardia. Pt was taken to OR . She was found to be 4 cm/100/-3 station. Due to her allergy, she was given gentamicin and clindamycin IV for surgical prophylaxis. However, at the time of the Cesarean, the pt had foul amniotic fluid and the prophylaxis was continued for 24 hrs. Pathology was consistent with acute chorioamnionitis.  See Op note for further details.  On postpartum day 2, she developed  a fever of 103F.  Blood cultures, urine culture, and chest x-ray were all negative.  She was restarted on Gentamicin and Clindamycin for 72 hours until she was afebrile for 24 hours.  By time of discharge on POD#5, her pain was controlled on oral pain medications; she had appropriate lochia and was ambulating, voiding without difficulty, tolerating regular diet and passing flatus.   She was deemed stable for discharge to home.  Baby was taken to NICU due to prematurity and was found to have abnormal head shape   Labs: CBC Latest Ref Rng & Units 10/20/2017 10/18/2017 10/17/2017  WBC 4.0 - 10.5 K/uL 18.8(H) 18.1(H) 26.4(H)  Hemoglobin 12.0 - 15.0 g/dL 4.0(J9.8(L) 10.5(L) 10.3(L)  Hematocrit 36.0 - 46.0 % 30.2(L) 32.1(L) 31.1(L)  Platelets 150 - 400 K/uL 404(H) 411(H) 341   A NEG  Physical exam:  BP 123/77 (BP Location: Left Arm)   Pulse (!) 110   Temp 99 F (37.2 C)   Resp 18   Ht 5\' 3"  (1.6 m)   Wt (!) 142 kg (313 lb 0.6 oz)   SpO2 100%   Breastfeeding? Unknown   BMI 55.45 kg/m  General: alert and no distress Pulm: normal respiratory effort Lochia: appropriate Abdomen: soft, NT Uterine Fundus: firm, below umbilicus Incision: c/d/i, healing well, no significant drainage, no dehiscence, no significant erythema Extremities: No evidence of DVT seen on physical exam. No lower extremity edema.   Disposition: stable, discharge to home Baby Feeding: breast milk and formula Baby Disposition: NICU; preterm   Contraception: unsure  Rh Immune globulin given: N/A; baby RH Negative Rubella vaccine given: N/A Tdap vaccine given in AP or PP setting:  Flu vaccine given in AP or PP setting: UTD   Plan:  Tara Porter was discharged to home in good condition. [facility] at [facility] in 6 weeks.  Discharge Instructions: Per After Visit Summary. Refer to After Visit Summary and [facility] discharge booklet  Activity: Advance as tolerated. Pelvic rest for 6 weeks.   Diet:  Regular, Heart Healthy Discharge Medications: Allergies as of 10/21/2017      Reactions   Amoxicillin Hives   Has patient had a PCN reaction causing immediate rash, facial/tongue/throat swelling, SOB or lightheadedness with hypotension: no Has patient had a PCN reaction causing severe rash involving mucus membranes or skin necrosis: no Has patient had a PCN reaction that required hospitalizationno Has patient had a PCN reaction occurring within the last 10 years: no If all of the above answers are "NO", then may proceed with Cephalosporin use.      Medication List    STOP taking these medications   metFORMIN 500 MG 24 hr tablet Commonly known as:  GLUCOPHAGE-XR     TAKE these medications   acetaminophen 325 MG tablet Commonly known as:  TYLENOL Take 650 mg by mouth every 6 (six) hours as needed for headache.   calcium carbonate 500 MG chewable tablet Commonly known as:  TUMS - dosed in mg elemental calcium Chew 1 tablet by mouth as needed for indigestion or heartburn.   citalopram 10 MG tablet Commonly known as:  CELEXA Take 10 mg by mouth daily.   ferrous sulfate 325 (65 FE) MG tablet Take 1 tablet (325 mg total) by mouth 2 (two) times daily with a meal.   ibuprofen 600 MG tablet Commonly known as:  ADVIL,MOTRIN Take 1 tablet (600 mg total) by mouth every 6 (six) hours.   oxyCODONE 5 MG immediate release tablet Commonly known as:  Oxy IR/ROXICODONE Take 1 tablet (5 mg total) by mouth every 4 (four) hours as needed for moderate pain (pain scale 4-7).   prenatal multivitamin Tabs tablet Take 1 tablet by mouth daily at 12 noon.      Outpatient follow up:  Follow-up Information    Maxie Better, MD Follow up in 6 week(s).   Specialty:  Obstetrics and Gynecology Why:  Postpartum visit Contact information: [address] [place] [address] [phone]           Signed:  Carlean Jews, MSN, CNM [facility]

## 2017-10-23 LAB — CULTURE, BLOOD (ROUTINE X 2)
CULTURE: NO GROWTH
Culture: NO GROWTH
Special Requests: ADEQUATE
Special Requests: ADEQUATE

## 2019-12-11 IMAGING — US US MFM OB DETAIL+14 WK
1 series · 14 of 28 positions shown · non-contrast
Comparison: none

[Series 1: us mfm ob detail+14 wk · 14 of 85 slices shown]
[im 4/85]
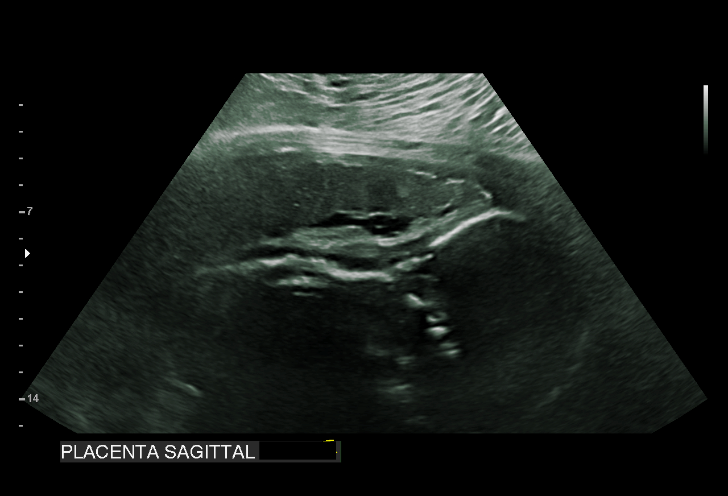
[im 10/85]
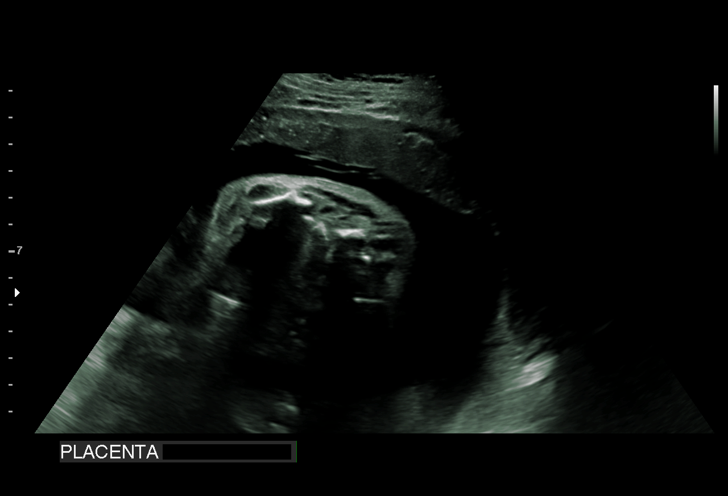
[im 16/85]
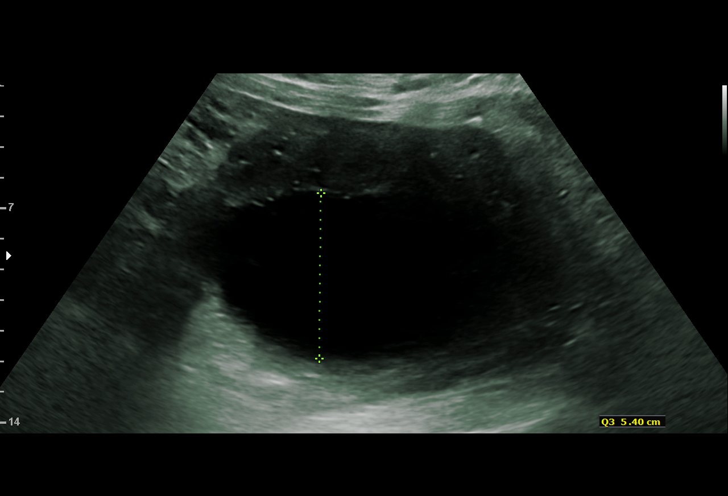
[im 22/85]
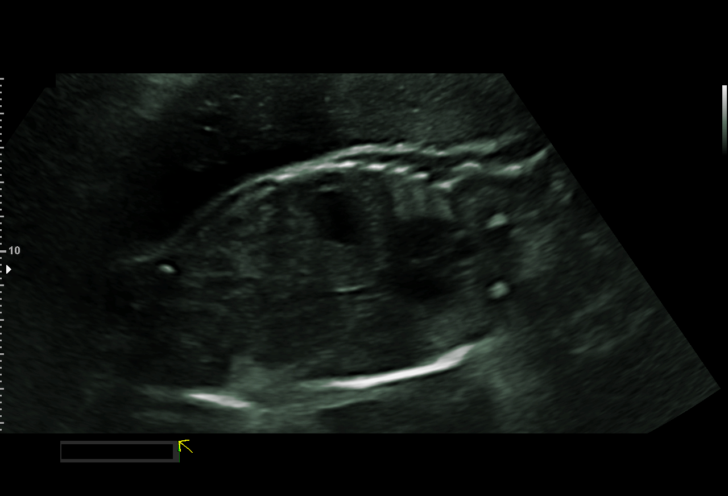
[im 29/85]
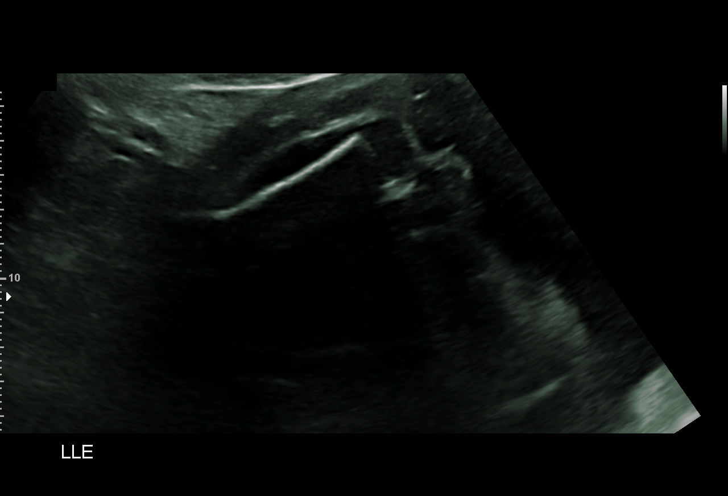
[im 35/85]
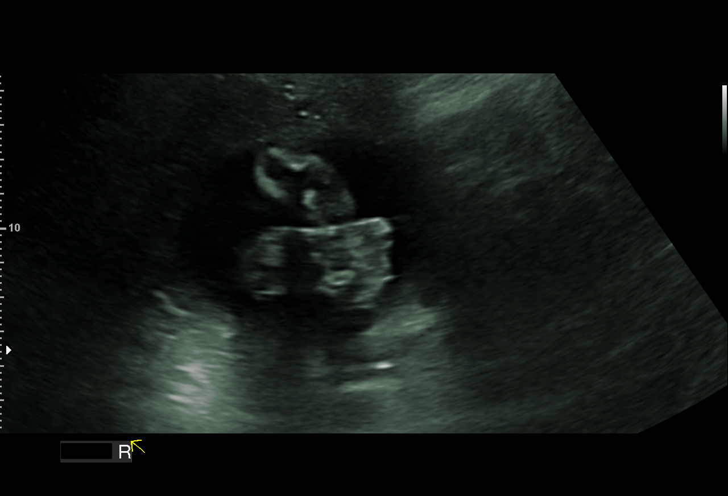
[im 41/85]
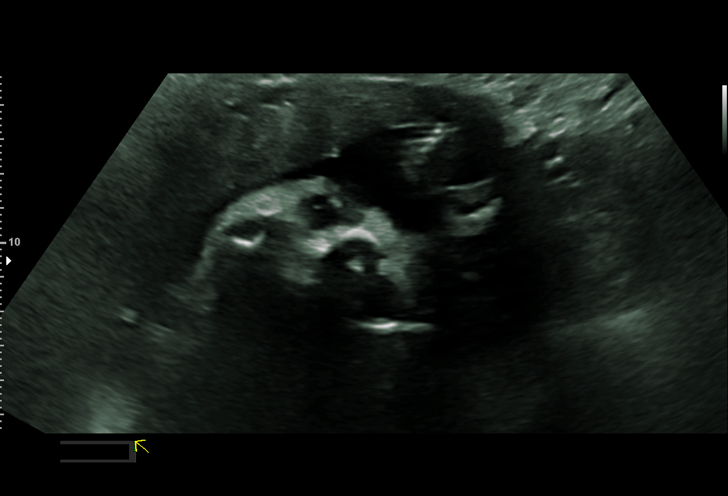
[im 47/85]
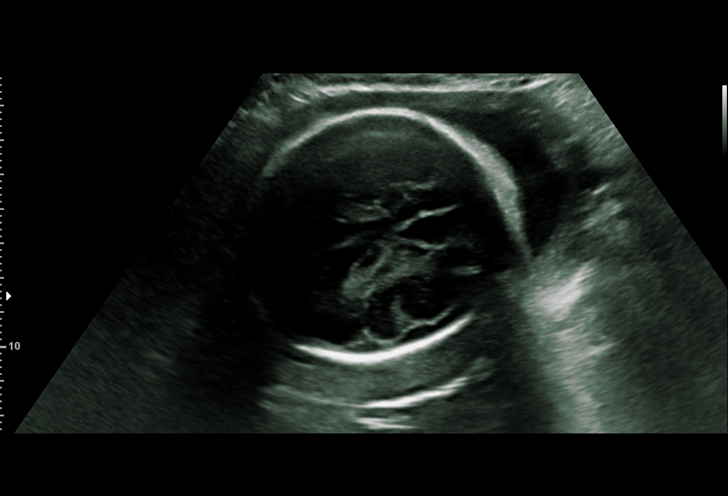
[im 53/85]
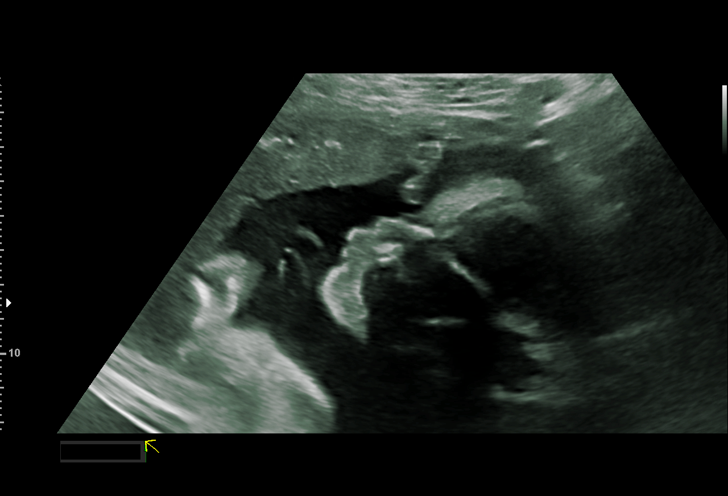
[im 60/85]
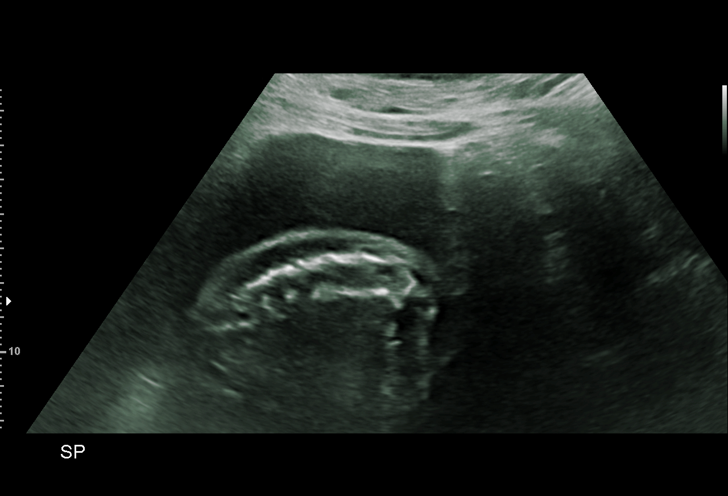
[im 66/85]
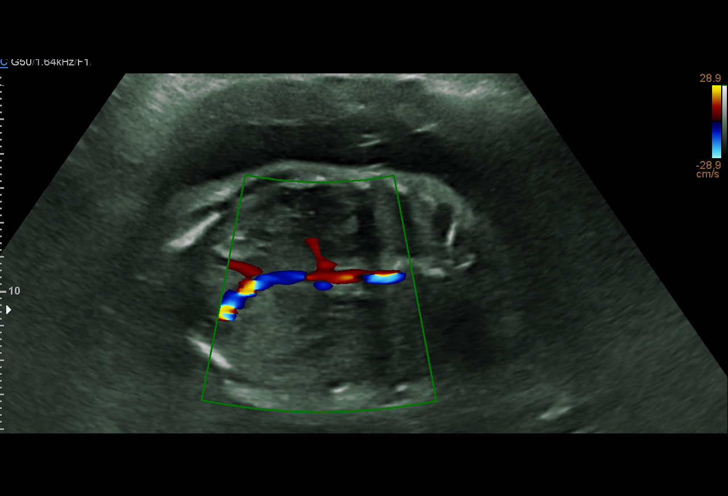
[im 72/85]
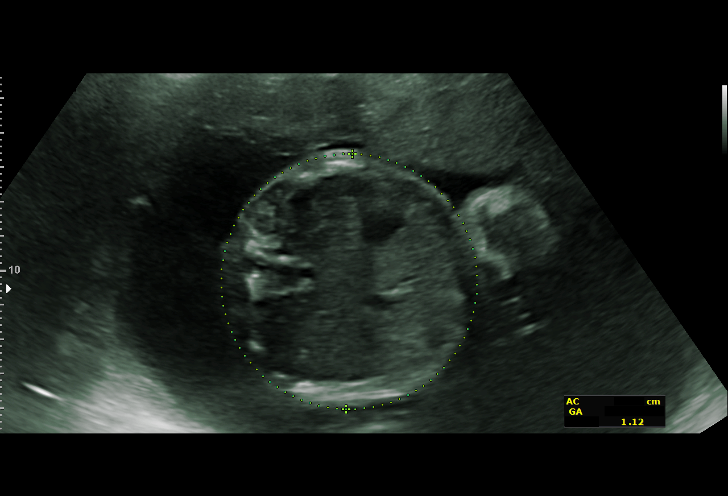
[im 78/85]
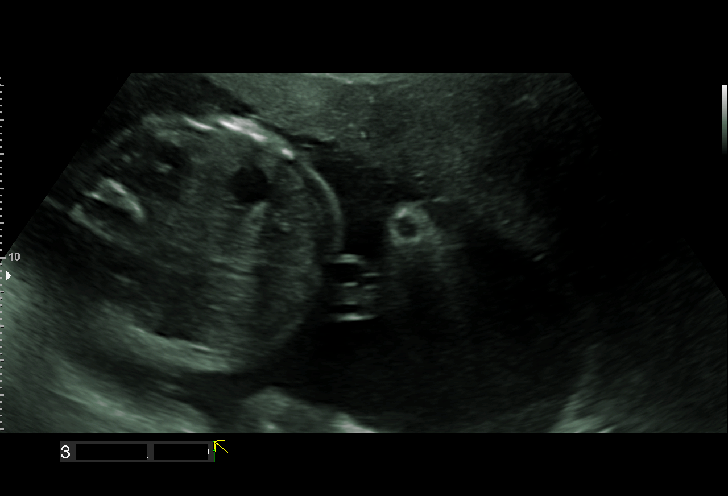
[im 85/85]
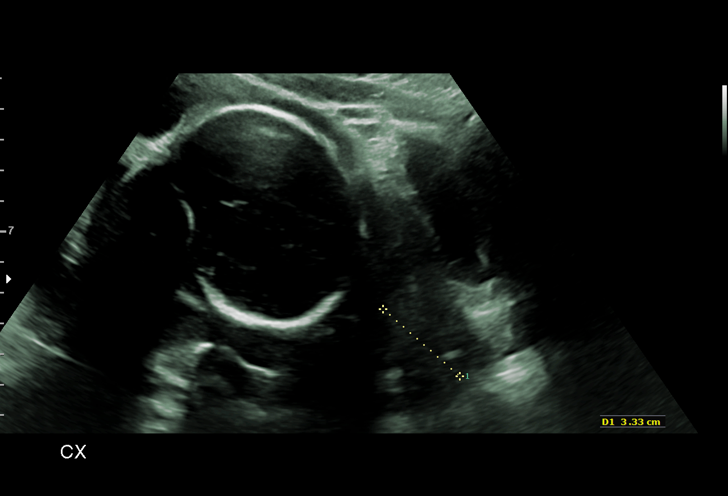

[14 of 28 positions shown; findings below may reference images not displayed]

[facility]
MAU/Triage

1  KLASSEN               [id number]      [id number]     [id number]
GUINTO
Indications

27 weeks gestation of pregnancy
Vaginal bleeding in pregnancy, second
trimester
Encounter for fetal anatomic survey
Maternal morbid obesity 314 lbs
Diabetes - Gestational
OB History

Gravidity:    2          SAB:   1
Living:       0
Fetal Evaluation

Num Of Fetuses:     1
Fetal Heart         165
Rate(bpm):
Cardiac Activity:   Observed
Presentation:       Cephalic
Placenta:           Anterior, above cervical os
P. Cord Insertion:  Not well visualized

Amniotic Fluid
AFI FV:      Subjectively within normal limits

AFI Sum(cm)     %Tile       Largest Pocket(cm)
17.39           65
RUQ(cm)                     LUQ(cm)        LLQ(cm)
6.66

Comment:    No placental abruption or previa identified.
Biometry

BPD:      71.8  mm     G. Age:  28w 6d         78  %    CI:        75.67   %    70 - 86
FL/HC:      19.9   %    18.8 -
HC:      261.7  mm     G. Age:  28w 3d         49  %    HC/AC:      1.11        1.05 -
AC:      234.8  mm     G. Age:  27w 5d         49  %    FL/BPD:     72.6   %    71 - 87
FL:       52.1  mm     G. Age:  27w 5d         42  %    FL/AC:      22.2   %    20 - 24
HUM:      47.5  mm     G. Age:  27w 6d         54  %
CER:      32.6  mm     G. Age:  28w 4d         63  %

LV:        5.4  mm
CM:        8.1  mm

Est. FW:    0087  gm      2 lb 8 oz     59  %
Gestational Age

Clinical EDD:  27w 4d                                        EDD:   01/07/18
U/S Today:     28w 1d                                        EDD:   01/03/18
Best:          27w 4d     Det. By:  Clinical EDD             EDD:   01/07/18
Anatomy

Cranium:               Appears normal         Aortic Arch:            Not well visualized
Cavum:                 Appears normal         Ductal Arch:            Not well visualized
Ventricles:            Appears normal         Diaphragm:              Appears normal
Choroid Plexus:        Appears normal         Stomach:                Appears normal, left
sided
Cerebellum:            Appears normal         Abdomen:                Appears normal
Posterior Fossa:       Appears normal         Abdominal Wall:         Appears nml (cord
insert, abd wall)
Nuchal Fold:           Not applicable (>20    Cord Vessels:           Appears normal (3
wks GA)                                        vessel cord)
Face:                  Appears normal         Kidneys:                Appear normal
(orbits and profile)
Lips:                  Appears normal         Bladder:                Appears normal
Thoracic:              Appears normal         Spine:                  Appears normal
Heart:                 Not well visualized    Upper Extremities:      LTD
RVOT:                  Not well visualized    Lower Extremities:      Appears normal
LVOT:                  Not well visualized

Other:  Heels visualized. Nasal bone visualized. Female gender. Technically
difficult due to maternal habitus.
Cervix Uterus Adnexa

Cervix
Length:            3.3  cm.

Left Ovary
Not visualized.

Right Ovary
Not visualized.
Impression

SIUP at 27+4 weeks
Normal detailed fetal anatomy; limited views of the heart
Normal amniotic fluid volume
Measurements consistent with stated EDC; EFW at the 59th
%tile
Anterior placenta; no previa; no subchorionic fluid
collections/hemorrhage identified
TA views of cervix: normal length without funneling
Recommendations

Follow-up as clinically indicated

## 2019-12-17 IMAGING — CR DG CHEST 2V
2 series · 2 of 2 positions shown · non-contrast
Comparison: None

CLINICAL DATA: Fever

EXAM:
CHEST  2 VIEW

[chest pa]
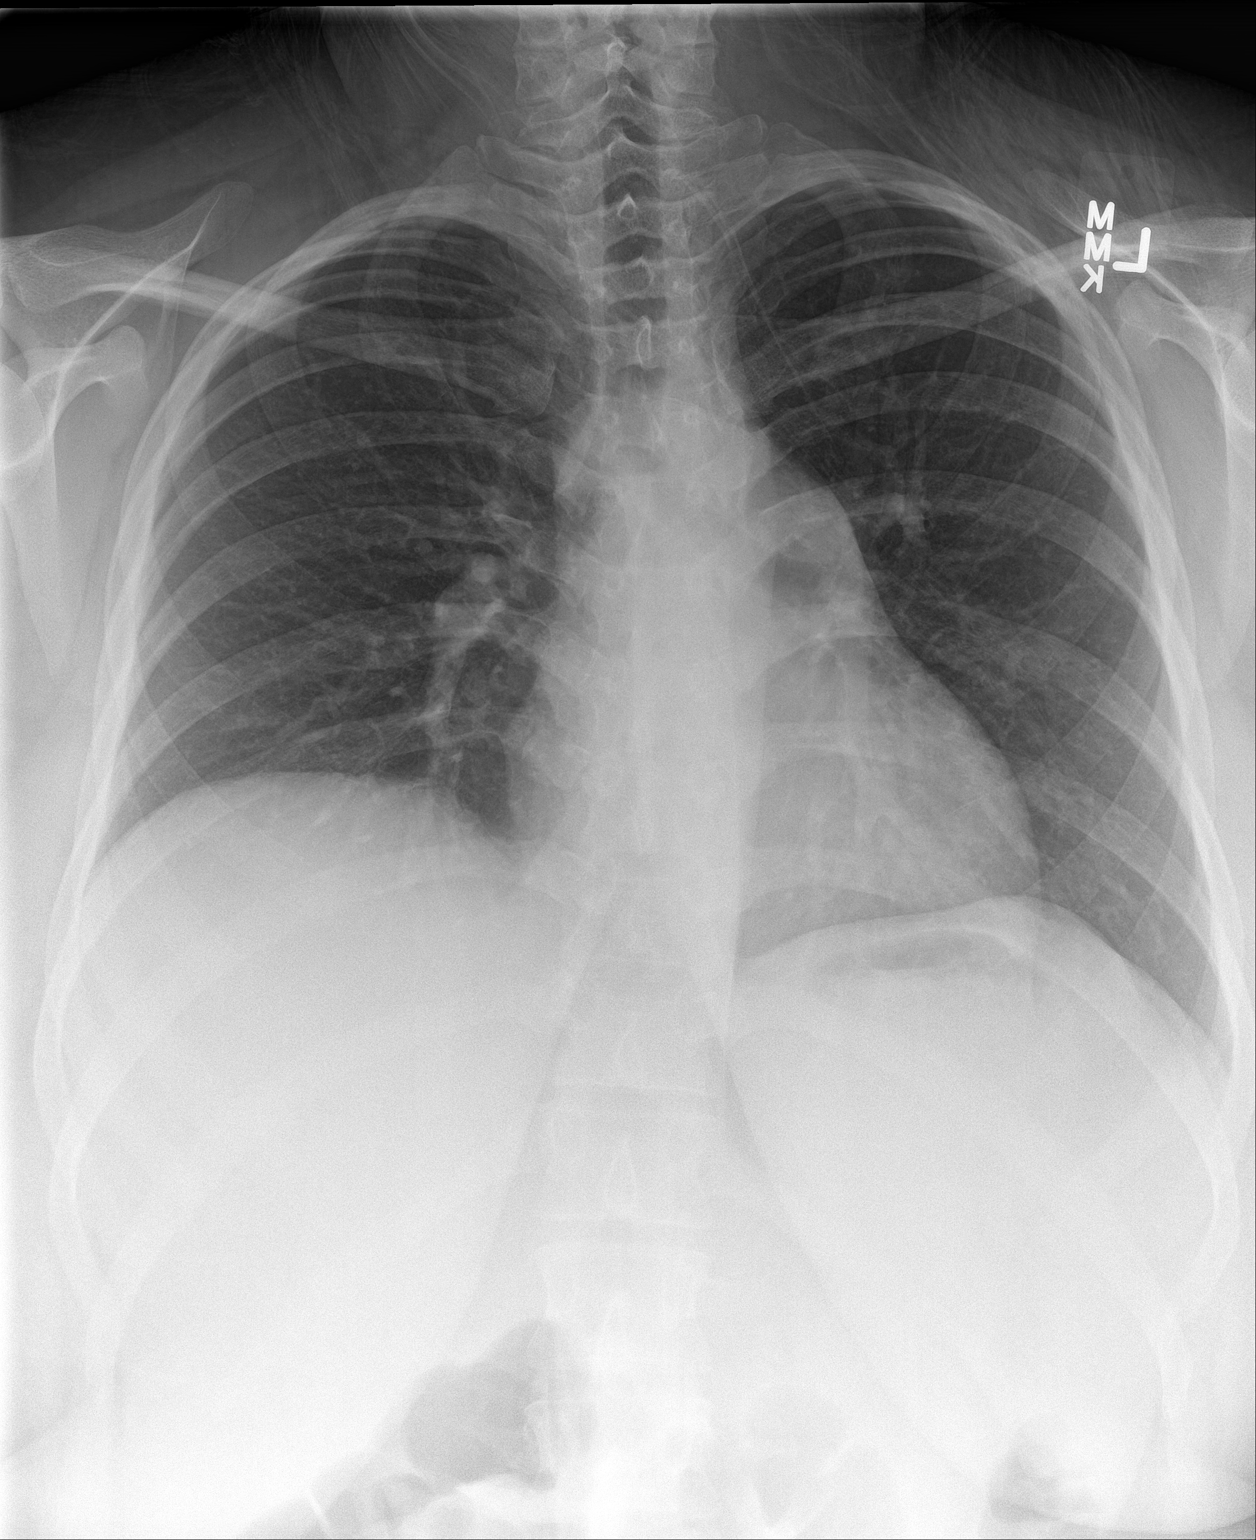

[chest lat]
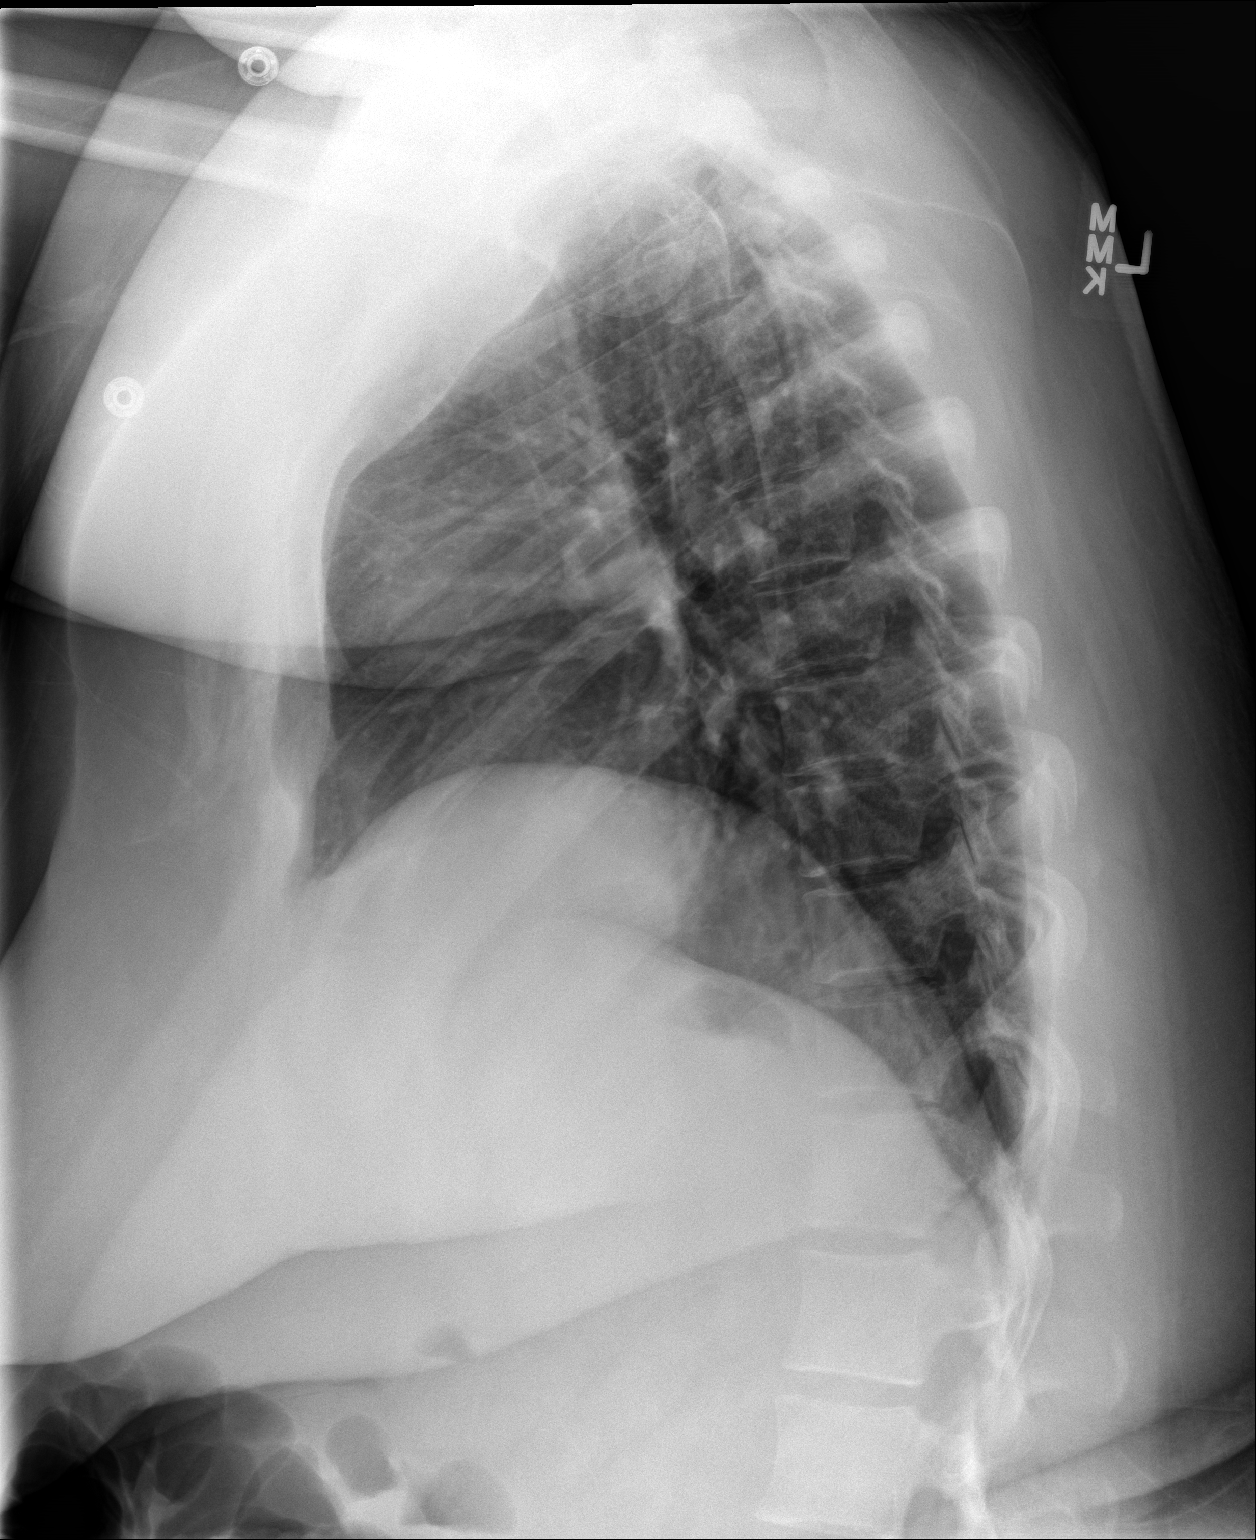

[2 of 2 positions shown; findings below may reference images not displayed]

FINDINGS: Normal heart size, mediastinal contours, and pulmonary vascularity.

Lungs clear.

No pleural effusion or pneumothorax.

Bones unremarkable.
IMPRESSION: No acute abnormalities.

## 2022-07-30 ENCOUNTER — Ambulatory Visit: Payer: 59 | Admitting: Family

## 2022-08-05 ENCOUNTER — Encounter: Payer: Self-pay | Admitting: Family Medicine

## 2022-08-05 ENCOUNTER — Ambulatory Visit (INDEPENDENT_AMBULATORY_CARE_PROVIDER_SITE_OTHER): Payer: 59 | Admitting: Family Medicine

## 2022-08-05 VITALS — BP 183/116 | HR 91 | Temp 98.4°F | Resp 16 | Ht 63.0 in | Wt 327.6 lb

## 2022-08-05 DIAGNOSIS — E282 Polycystic ovarian syndrome: Secondary | ICD-10-CM | POA: Insufficient documentation

## 2022-08-05 DIAGNOSIS — R030 Elevated blood-pressure reading, without diagnosis of hypertension: Secondary | ICD-10-CM

## 2022-08-05 DIAGNOSIS — G4719 Other hypersomnia: Secondary | ICD-10-CM

## 2022-08-05 DIAGNOSIS — N3944 Nocturnal enuresis: Secondary | ICD-10-CM

## 2022-08-05 DIAGNOSIS — F419 Anxiety disorder, unspecified: Secondary | ICD-10-CM | POA: Insufficient documentation

## 2022-08-05 DIAGNOSIS — Z6841 Body Mass Index (BMI) 40.0 and over, adult: Secondary | ICD-10-CM | POA: Diagnosis not present

## 2022-08-05 DIAGNOSIS — R7303 Prediabetes: Secondary | ICD-10-CM

## 2022-08-05 NOTE — Patient Instructions (Addendum)
Thank you for choosing [facility] at [facility] for your Primary Care needs. I am excited for the opportunity to partner with you to meet your health care goals. It was a pleasure meeting you today!  - Labs today, we will likely restart metformin for PCOS. Please schedule a follow-up with OBGYN - Referrals placed to Urology, Weight Management and Pulmonology  Blood pressure isn't at goal for age and co-morbidities.   Recommendations: focus lifestyle measures, recheck in 2 weeks  - BP goal <130/80 - monitor and log blood pressures at home - check around the same time each day in a relaxed setting - Limit salt to <2000 mg/day - Follow DASH eating plan (heart healthy diet) - limit alcohol to 2 standard drinks per day for men and 1 per day for women - avoid tobacco products - get at least 2 hours of regular aerobic exercise weekly Patient aware of signs/symptoms requiring further/urgent evaluation. Labs updated today.   Information on diet, exercise, and health maintenance recommendations are listed below. This is information to help you be sure you are on track for optimal health and monitoring.   Please look over this and let us know if you have any questions or if you have completed any of the health maintenance outside of [place] so that we can be sure your records are up to date.  ___________________________________________________________  MyChart:  For all urgent or time sensitive needs we ask that you please call the office to avoid delays. Our number is [phone]. MyChart is not constantly monitored and due to the large volume of messages a day, replies may take up to 72 business hours.  MyChart Policy: MyChart allows for you to see your visit notes, after visit summary, provider recommendations, lab and tests results, make an appointment, request refills, and contact your provider or the office for non-urgent questions or concerns. Providers are seeing  patients during normal business hours and do not have built in time to review MyChart messages.  We ask that you allow a minimum of 3 business days for responses to [employer]. For this reason, please do not send urgent requests through [place]. Please call the office at [phone]. New and ongoing conditions may require a visit. We have virtual and in-person visits available for your convenience.  Complex MyChart concerns may require a visit. Your provider may request you schedule a virtual or in-person visit to ensure we are providing the best care possible. MyChart messages sent after 11:00 AM on Friday will not be received by the provider until Monday morning.    Lab and Test Results: You will receive your lab and test results on MyChart as soon as they are completed and results have been sent by the lab or testing facility. Due to this service, you will receive your results BEFORE your provider.  I review lab and test results each morning prior to seeing patients. Some results require collaboration with other providers to ensure you are receiving the most appropriate care. For this reason, we ask that you please allow a minimum of 3-5 business days from the time that ALL results have been received for your provider to receive and review lab and test results and contact you about these.  Most lab and test result comments from the provider will be sent through [place]. Your provider may recommend changes to the plan of care, follow-up visits, repeat testing, ask questions, or request an office visit to discuss these results. You  may reply directly to this message or call the office to provide information for the provider or set up an appointment. In some instances, you will be called with test results and recommendations. Please let us know if this is preferred and we will make note of this in your chart to provide this for you.    If you have not heard a response to your lab or test results in  5 business days from all results returning to [place], please call the office to let us know. We ask that you please avoid calling prior to this time unless there is an emergent concern. Due to high call volumes, this can delay the resulting process.  After Hours: For all non-emergency after hours needs, please call the office at [phone] and select the option to reach the on-call  service. On-call services are shared between multiple [facility] offices and therefore it will not be possible to speak directly with your provider. On-call providers may provide medical advice and recommendations, but are unable to provide refills for maintenance medications.  For all emergency or urgent medical needs after normal business hours, we recommend that you seek care at the closest Urgent Care or Emergency Department to ensure appropriate treatment in a timely manner.  [facility] has a 24 hour emergency room located on the ground floor for your convenience.   Urgent Concerns During the Business Day Providers are seeing patients from 8AM to [place] with a busy schedule and are most often not able to respond to non-urgent calls until the end of the day or the next business day. If you should have URGENT concerns during the day, please call and speak to the nurse or schedule a same day appointment so that we can address your concern without delay.   Thank you, again, for choosing me as your health care partner. I appreciate your trust and look forward to learning more about you.   Purcell Nails Olevia Bowens, DNP, FNP-C  ___________________________________________________________  Health Maintenance Recommendations Screening Testing Mammogram Every 1-2 years based on history and risk factors Starting at age 64 Pap Smear Ages 21-39 every 3 years Ages 36-65 every 5 years with HPV testing More frequent testing may be required based on results and history Colon Cancer Screening Every 1-10 years  based on test performed, risk factors, and history Starting at age 66 Bone Density Screening Every 2-10 years based on history Starting at age 66 for women Recommendations for men differ based on medication usage, history, and risk factors AAA Screening One time ultrasound Men 70-65 years old who have ever smoked Lung Cancer Screening Low Dose Lung CT every 12 months Age 7-80 years with a 20 pack-year smoking history who still smoke or who have quit within the last 15 years  Screening Labs Routine  Labs: Complete Blood Count (CBC), Complete Metabolic Panel (CMP), Cholesterol (Lipid Panel) Every 6-12 months based on history and medications May be recommended more frequently based on current conditions or previous results Hemoglobin A1c Lab Every 3-12 months based on history and previous results Starting at age 44 or earlier with diagnosis of diabetes, high cholesterol, BMI >26, and/or risk factors Frequent monitoring for patients with diabetes to ensure blood sugar control Thyroid Panel (TSH w/ T3 & T4) Every 6 months based on history, symptoms, and risk factors May be repeated more often if on medication HIV One time testing for all patients 83 and older May be repeated more frequently for patients with increased  risk factors or exposure Hepatitis C One time testing for all patients 26 and older May be repeated more frequently for patients with increased risk factors or exposure Gonorrhea, Chlamydia Every 12 months for all sexually active persons 13-24 years Additional monitoring may be recommended for those who are considered high risk or who have symptoms PSA Men 102-41 years old with risk factors Additional screening may be recommended from age 55-69 based on risk factors, symptoms, and history  Vaccine Recommendations Tetanus Booster All adults every 10 years Flu Vaccine All patients 6 months and older every year COVID Vaccine All patients 12 years and older Initial  dosing with booster May recommend additional booster based on age and health history HPV Vaccine 2 doses all patients age 33-26 Dosing may be considered for patients over 26 Shingles Vaccine (Shingrix) 2 doses all adults 60 years and older Pneumonia (Pneumovax 68) All adults 62 years and older May recommend earlier dosing based on health history Pneumonia (Prevnar 64) All adults 12 years and older Dosed 1 year after Pneumovax 23 Pneumonia (Prevnar 94) All adults 60 years and older (adults [id number] with certain conditions or risk factors) 1 dose  For those who have no received Prevnar 13 vaccine previously   Additional Screening, Testing, and Vaccinations may be recommended on an individualized basis based on family history, health history, risk factors, and/or exposure.  __________________________________________________________  Diet Recommendations for All Patients  I recommend that all patients maintain a diet low in saturated fats, carbohydrates, and cholesterol. While this can be challenging at first, it is not impossible and small changes can make big differences.  Things to try: Decreasing the amount of soda, sweet tea, and/or juice to one or less per day and replace with water While water is always the first choice, if you do not like water you may consider adding a water additive without sugar to improve the taste other sugar free drinks Replace potatoes with a brightly colored vegetable at dinner Use healthy oils, such as canola oil or olive oil, instead of butter or hard margarine Limit your bread intake to two pieces or less a day Replace regular pasta with low carb pasta options Bake, broil, or grill foods instead of frying Monitor portion sizes  Eat smaller, more frequent meals throughout the day instead of large meals  An important thing to remember is, if you love foods that are not great for your health, you don't have to give them up completely. Instead, allow  these foods to be a reward when you have done well. Allowing yourself to still have special treats every once in a while is a nice way to tell yourself thank you for working hard to keep yourself healthy.   Also remember that every day is a new day. If you have a bad day and "fall off the wagon", you can still climb right back up and keep moving along on your journey!  We have resources available to help you!  Some websites that may be helpful include: www.http://carter.biz/  Www.VeryWellFit.com _____________________________________________________________  Activity Recommendations for All Patients  I recommend that all adults get at least 20 minutes of moderate physical activity that elevates your heart rate at least 5 days out of the week.  Some examples include: Walking or jogging at a pace that allows you to carry on a conversation Cycling (stationary bike or outdoors) Water aerobics Yoga Weight lifting Dancing If physical limitations prevent you from putting stress on your joints, exercise in a pool  or seated in a chair are excellent options.  Do determine your MAXIMUM heart rate for activity: 220 - YOUR AGE = MAX Heart Rate   Remember! Do not push yourself too hard.  Start slowly and build up your pace, speed, weight, time in exercise, etc.  Allow your body to rest between exercise and get good sleep. You will need more water than normal when you are exerting yourself. Do not wait until you are thirsty to drink. Drink with a purpose of getting in at least 8, 8 ounce glasses of water a day plus more depending on how much you exercise and sweat.    If you begin to develop dizziness, chest pain, abdominal pain, jaw pain, shortness of breath, headache, vision changes, lightheadedness, or other concerning symptoms, stop the activity and allow your body to rest. If your symptoms are severe, seek emergency evaluation immediately. If your symptoms are concerning, but not severe, please let us  know so that we can recommend further evaluation.

## 2022-08-05 NOTE — Assessment & Plan Note (Signed)
Previously diagnosed, no new concerns. She is open to restarting metformin after lab results are in. She is planning to re-establish with GYN soon.

## 2022-08-05 NOTE — Assessment & Plan Note (Signed)
Updating labs today  Focus on heart healthy, low-carb diet, regular physical activity, and weight management

## 2022-08-05 NOTE — Assessment & Plan Note (Signed)
No SI/HI Reports she has not had any problems lately and does not feel like she needs medications or counseling at this time.

## 2022-08-05 NOTE — Progress Notes (Signed)
New Patient Office Visit  Subjective    Patient ID: Tara Porter, female    DOB: 05/12/1996  Age: 26 y.o. MRN: [id number]  CC:  Chief Complaint  Patient presents with   Establish Care    HPI [facility] presents to establish care. She was with [facility] for awhile and then saw [facility] for a few visits.   Anxiety: Reports that daughter was born at 104 weeks (emergency c-section). Anxiety started after that. She is now a healthy 2-year-old, doing well. Reports she saw psych for awhile and was on Lamictal, Abilify, Buspar in the past, but not currently on anything. Mood feels pretty stable now. No new concerns. No SI/HI.    PCOS: Diagnosed around age 42. She was on metformin and OCPs for awhile. She is not currently following with an OBGYN. Periods are irregular now and left side is often painful during cycles. She cannot remember the last time she had any imaging to monitor. She has not been on medications since she was pregnant with daughter. Reports pap was last year at [facility] in [place] (requesting records). She does not currently have an OBGYN for routine care, but is planning to re-establish soon.   Prediabetes/Obesity: She has not been on any medications since metformin for PCOS. States labs fluctuated for awhile. She is ready to start taking better care of herself.  States weight was up to 350+ at one point, but she has been trying to be more consistent with taking care of herself. She is using the "Lose it" app and watching her calories (~1600/day), low carb, low sugar. She is exercising 3-4 times per week and is about to get a personal trainer at the gym after Christmas. She is not interested in medications at this time, but would like a referral to [facility].   Bladder concerns: Lately she has been struggling with having urinary incontinence during the night. States she has always had some frequency, but lately she has  been struggling with wetting the bed at night occasionally. She does admit to having this as a problem when she was younger.  She stops drinking liquids after dinner, uses the bathroom before bed, and sets alarms to get up to try to urinate to prevent accidents. She denies any urgency, hesitancy, dysuria, hematuria.     STOP-BANG for SLEEP APNEA Do you Snore loudly? No Do you often feel Tired during day? Yes Has anyone Observed you stop breathing? No History of high blood Pressure? Yes BMI >35? Yes Age >50? No Neck circumference >16 in? Yes Gender female? No 5-8 = high risk 3-4 = intermediate 0-2 = low risk     Outpatient Encounter Medications as of 08/05/2022  Medication Sig   [DISCONTINUED] acetaminophen (TYLENOL) 325 MG tablet Take 650 mg by mouth every 6 (six) hours as needed for headache.   [DISCONTINUED] calcium carbonate (TUMS - DOSED IN MG ELEMENTAL CALCIUM) 500 MG chewable tablet Chew 1 tablet by mouth as needed for indigestion or heartburn.   [DISCONTINUED] citalopram (CELEXA) 10 MG tablet Take 10 mg by mouth daily.   [DISCONTINUED] ferrous sulfate 325 (65 FE) MG tablet Take 1 tablet (325 mg total) by mouth 2 (two) times daily with a meal.   [DISCONTINUED] ibuprofen (ADVIL,MOTRIN) 600 MG tablet Take 1 tablet (600 mg total) by mouth every 6 (six) hours.   [DISCONTINUED] oxyCODONE (OXY IR/ROXICODONE) 5 MG immediate release tablet Take 1 tablet (5 mg total) by mouth every 4 (four)  hours as needed for moderate pain (pain scale 4-7).   [DISCONTINUED] Prenatal Vit-Fe Fumarate-FA (PRENATAL MULTIVITAMIN) TABS tablet Take 1 tablet by mouth daily at 12 noon.   No facility-administered encounter medications on file as of 08/05/2022.    Past Medical History:  Diagnosis Date   Anxiety    Gestational diabetes    PCOS (polycystic ovarian syndrome)    PCOS (polycystic ovarian syndrome)    Prediabetes    UTI (lower urinary tract infection)     Past Surgical History:  Procedure  Laterality Date   CESAREAN SECTION N/A 10/16/2017   Procedure: CESAREAN SECTION;  Surgeon: Maxie Better, MD;  Location: [facility];  Service: Obstetrics;  Laterality: N/A;   TONSILLECTOMY      History reviewed. No pertinent family history.  Social History   Socioeconomic History   Marital status: Married    Spouse name: Not on file   Number of children: Not on file   Years of education: Not on file   Highest education level: Not on file  Occupational History   Not on file  Tobacco Use   Smoking status: Never   Smokeless tobacco: Never  Substance and Sexual Activity   Alcohol use: No   Drug use: No   Sexual activity: Yes    Birth control/protection: None  Other Topics Concern   Not on file  Social History Narrative   Not on file   Social Determinants of Health   Financial Resource Strain: Not on file  Food Insecurity: Not on file  Transportation Needs: Not on file  Physical Activity: Not on file  Stress: Not on file  Social Connections: Not on file  Intimate Partner Violence: Not on file    ROS All review of systems negative except what is listed in the HPI      Objective    BP (!) 183/116   Pulse 91   Temp 98.4 F (36.9 C)   Resp 16   Ht 5\' 3"  (1.6 m)   Wt (!) 327 lb 9.6 oz (148.6 kg)   SpO2 98%   BMI 58.03 kg/m   Physical Exam Vitals reviewed.  Constitutional:      Appearance: Normal appearance. She is obese.  Cardiovascular:     Rate and Rhythm: Normal rate and regular rhythm.     Pulses: Normal pulses.     Heart sounds: Normal heart sounds.  Pulmonary:     Effort: Pulmonary effort is normal.     Breath sounds: Normal breath sounds.  Skin:    General: Skin is warm and dry.  Neurological:     Mental Status: She is alert and oriented to person, place, and time.  Psychiatric:        Mood and Affect: Mood normal.        Behavior: Behavior normal.        Thought Content: Thought content normal.        Judgment: Judgment normal.          Assessment & Plan:   Problem List Items Addressed This Visit       Endocrine   PCOS (polycystic ovarian syndrome)    Previously diagnosed, no new concerns. She is open to restarting metformin after lab results are in. She is planning to re-establish with GYN soon.      Relevant Orders   HgB A1c     Other   Anxiety - Primary    No SI/HI Reports she has not had any problems  lately and does not feel like she needs medications or counseling at this time.        Prediabetes    Updating labs today  Focus on heart healthy, low-carb diet, regular physical activity, and weight management       Relevant Orders   HgB A1c   Class 3 severe obesity with serious comorbidity and body mass index (BMI) of 50.0 to 59.9 in adult [facility])    Labs today Referral to [facility] Discussed healthy lifestyle choices, diet, exercise       Relevant Orders   CBC with Differential/Platelet   Comprehensive metabolic panel   Lipid panel   TSH   Amb Ref to [facility] Weight [facility]   HgB A1c   Other Visit Diagnoses     Excessive daytime sleepiness     Referral to pulm for likely sleep study    Relevant Orders   Ambulatory referral to Pulmonology   Nocturnal enuresis     Referral to urology to further evaluate and manage   Relevant Orders   Ambulatory referral to Urology   Elevated blood pressure reading     Asymptomatic, reports she is just nervous to be in a new office Blood pressure is not at goal for age and co-morbidities.   Recommendations: monitor at home and follow-up in 2 weeks  - BP goal <130/80 - monitor and log blood pressures at home - check around the same time each day in a relaxed setting - Limit salt to <2000 mg/day - Follow DASH eating plan (heart healthy diet) - limit alcohol to 2 standard drinks per day for men and 1 per day for women - avoid tobacco products - get at least 2 hours of regular aerobic exercise weekly Patient aware of signs/symptoms requiring  further/urgent evaluation. Labs updated today.         Return in about 2 weeks (around 08/19/2022) for BP f/u with Ladona Ridgel .   Clayborne Dana, NP

## 2022-08-05 NOTE — Assessment & Plan Note (Signed)
Labs today Referral to [facility] Discussed healthy lifestyle choices, diet, exercise

## 2022-08-06 LAB — CBC WITH DIFFERENTIAL/PLATELET
Basophils Absolute: 0.1 10*3/uL (ref 0.0–0.1)
Basophils Relative: 1.2 % (ref 0.0–3.0)
Eosinophils Absolute: 0.1 10*3/uL (ref 0.0–0.7)
Eosinophils Relative: 1 % (ref 0.0–5.0)
HCT: 37.8 % (ref 36.0–46.0)
Hemoglobin: 12.4 g/dL (ref 12.0–15.0)
Lymphocytes Relative: 26.9 % (ref 12.0–46.0)
Lymphs Abs: 2.6 10*3/uL (ref 0.7–4.0)
MCHC: 32.7 g/dL (ref 30.0–36.0)
MCV: 78 fl (ref 78.0–100.0)
Monocytes Absolute: 0.4 10*3/uL (ref 0.1–1.0)
Monocytes Relative: 3.8 % (ref 3.0–12.0)
Neutro Abs: 6.5 10*3/uL (ref 1.4–7.7)
Neutrophils Relative %: 67.1 % (ref 43.0–77.0)
Platelets: 426 10*3/uL — ABNORMAL HIGH (ref 150.0–400.0)
RBC: 4.85 Mil/uL (ref 3.87–5.11)
RDW: 14.3 % (ref 11.5–15.5)
WBC: 9.6 10*3/uL (ref 4.0–10.5)

## 2022-08-06 LAB — COMPREHENSIVE METABOLIC PANEL
ALT: 15 U/L (ref 0–35)
AST: 14 U/L (ref 0–37)
Albumin: 4.1 g/dL (ref 3.5–5.2)
Alkaline Phosphatase: 67 U/L (ref 39–117)
BUN: 9 mg/dL (ref 6–23)
CO2: 28 mEq/L (ref 19–32)
Calcium: 8.8 mg/dL (ref 8.4–10.5)
Chloride: 101 mEq/L (ref 96–112)
Creatinine, Ser: 0.61 mg/dL (ref 0.40–1.20)
GFR: 121.4 mL/min (ref >60.00)
Glucose, Bld: 79 mg/dL (ref 70–99)
Potassium: 4 mEq/L (ref 3.5–5.1)
Sodium: 138 mEq/L (ref 135–145)
Total Bilirubin: 0.4 mg/dL (ref 0.2–1.2)
Total Protein: 7 g/dL (ref 6.0–8.3)

## 2022-08-06 LAB — LIPID PANEL
Cholesterol: 168 mg/dL (ref 0–200)
HDL: 37.3 mg/dL — ABNORMAL LOW (ref >39.00)
LDL Cholesterol: 113 mg/dL — ABNORMAL HIGH (ref 0–99)
NonHDL: 130.44
Total CHOL/HDL Ratio: 4
Triglycerides: 85 mg/dL (ref 0.0–149.0)
VLDL: 17 mg/dL (ref 0.0–40.0)

## 2022-08-06 LAB — HEMOGLOBIN A1C: Hgb A1c MFr Bld: 6.1 % (ref 4.6–6.5)

## 2022-08-07 LAB — TSH: TSH: 2.14 u[IU]/mL (ref 0.35–5.50)

## 2022-08-19 ENCOUNTER — Ambulatory Visit: Payer: 59 | Admitting: Family Medicine

## 2022-09-02 ENCOUNTER — Encounter: Payer: Self-pay | Admitting: Urology

## 2022-10-07 ENCOUNTER — Ambulatory Visit (INDEPENDENT_AMBULATORY_CARE_PROVIDER_SITE_OTHER): Payer: 59 | Admitting: Urology

## 2022-10-07 ENCOUNTER — Encounter: Payer: Self-pay | Admitting: Urology

## 2022-10-07 VITALS — BP 177/98 | HR 137 | Ht 63.0 in | Wt 328.0 lb

## 2022-10-07 DIAGNOSIS — N3944 Nocturnal enuresis: Secondary | ICD-10-CM | POA: Diagnosis not present

## 2022-10-07 DIAGNOSIS — N3281 Overactive bladder: Secondary | ICD-10-CM

## 2022-10-07 LAB — URINALYSIS
Bilirubin, UA: NEGATIVE
Glucose, UA: NEGATIVE mg/dL
Ketones, POC UA: NEGATIVE mg/dL
Leukocytes, UA: NEGATIVE
Nitrite, UA: NEGATIVE
Protein Ur, POC: NEGATIVE mg/dL
Spec Grav, UA: 1.02 (ref 1.010–1.025)
Urobilinogen, UA: 0.2 E.U./dL
pH, UA: 7 (ref 5.0–8.0)

## 2022-10-07 MED ORDER — SOLIFENACIN SUCCINATE 10 MG PO TABS: 10.0000 mg | ORAL_TABLET | Freq: Every day | ORAL | 3 refills | Status: DC

## 2022-10-07 NOTE — Progress Notes (Signed)
Assessment: OAB Nocturnal enuresis   Plan: Today I had a long and detailed discussion with the patient regarding her voiding issues most consistent with OAB but also complicated by nocturnal enuresis.  I discussed with her that her issues are likely multifactorial with contributing factors being her morbid obesity, borderline diabetes, possible obstructive sleep apnea, and given her long history of bladder instability. We discussed options for further evaluation and treatment. Patient elects to begin new medical therapy with Solifenacin 10 mg daily Nature of medication including proper utilization as well as potential adverse events and side effects reviewed. Also discussed with the patient behavioral modifications including afternoon and evening fluid restriction and recumbency.  I encouraged her to follow-up with her sleep study and consideration of metformin for her polycystic ovarian syndrome and borderline diabetes.  Patient will return in 4 months for recheck  Chief Complaint: bladder issues  History of Present Illness:  Ermer Clatterbuck is a 27 y.o. female who is seen in consultation from Terrilyn Saver, NP for evaluation of bladder issues.  Patient is very pleasant and is accompanied today by her husband and young daughter. She has a past medical history of morbid obesity, borderline diabetes, polycystic ovarian syndrome and what she describes as lifelong bladder issues.  She is also scheduled to undergo sleep study due to suspicion of OSA.  The patient states that again she has had longstanding voiding issues.  As a teenager she had significant OAB type symptoms with frequency urgency and urge incontinence and was treated with Solifenacin with excellent results.  She took it for a few years but then discontinued it.  Patient also has a long history of nocturnal enuresis as a teenager and intermittently since that time.  She continues to have some episodes generally 1-2 times  per week.  She states she wakes up wet and routinely gets up 2-3 times per night to void.  Patient also has significant daytime symptoms of frequency urgency and urge incontinence as well and changes several pantiliners per day which are typically damp.  She denies any problems with recurrent UTIs.  UA today is clear.      Past Medical History:  Past Medical History:  Diagnosis Date   Anxiety    Gestational diabetes    PCOS (polycystic ovarian syndrome)    PCOS (polycystic ovarian syndrome)    Prediabetes    UTI (lower urinary tract infection)     Past Surgical History:  Past Surgical History:  Procedure Laterality Date   CESAREAN SECTION N/A 10/16/2017   Procedure: CESAREAN SECTION;  Surgeon: Servando Salina, MD;  Location: [facility];  Service: Obstetrics;  Laterality: N/A;   TONSILLECTOMY      Allergies:  Allergies  Allergen Reactions   Amoxicillin Hives    Has patient had a PCN reaction causing immediate rash, facial/tongue/throat swelling, SOB or lightheadedness with hypotension: no Has patient had a PCN reaction causing severe rash involving mucus membranes or skin necrosis: no Has patient had a PCN reaction that required hospitalizationno Has patient had a PCN reaction occurring within the last 10 years: no If all of the above answers are "NO", then may proceed with Cephalosporin use.      Family History:  No family history on file.  Social History:  Social History   Tobacco Use   Smoking status: Never   Smokeless tobacco: Never  Substance Use Topics   Alcohol use: No   Drug use: No    Review of symptoms:  Constitutional:  Negative for unexplained weight loss, night sweats, fever, chills ENT:  Negative for nose bleeds, sinus pain, painful swallowing CV:  Negative for chest pain, shortness of breath, exercise intolerance, palpitations, loss of consciousness Resp:  Negative for cough, wheezing, shortness of breath GI:  Negative for nausea,  vomiting, diarrhea, bloody stools GU:  Positives noted in HPI; otherwise negative for gross hematuria, dysuria, urinary incontinence Neuro:  Negative for seizures, poor balance, limb weakness, slurred speech Psych:  Negative for lack of energy, depression, anxiety Endocrine:  Negative for polydipsia, polyuria, symptoms of hypoglycemia (dizziness, hunger, sweating) Hematologic:  Negative for anemia, purpura, petechia, prolonged or excessive bleeding, use of anticoagulants  Allergic:  Negative for difficulty breathing or choking as a result of exposure to anything; no shellfish allergy; no allergic response (rash/itch) to materials, foods  Physical exam: There were no vitals taken for this visit. GENERAL APPEARANCE: Morbidly obese white female, NAD

## 2022-10-14 ENCOUNTER — Institutional Professional Consult (permissible substitution): Payer: 59 | Admitting: Pulmonary Disease

## 2022-10-18 ENCOUNTER — Encounter: Payer: Self-pay | Admitting: Family Medicine

## 2022-10-18 DIAGNOSIS — R052 Subacute cough: Secondary | ICD-10-CM

## 2022-10-21 ENCOUNTER — Ambulatory Visit (INDEPENDENT_AMBULATORY_CARE_PROVIDER_SITE_OTHER): Payer: 59 | Admitting: Family Medicine

## 2022-10-21 ENCOUNTER — Encounter: Payer: Self-pay | Admitting: Family Medicine

## 2022-10-21 VITALS — BP 140/80 | HR 97 | Temp 98.3°F | Resp 18 | Ht 63.0 in | Wt 337.0 lb

## 2022-10-21 DIAGNOSIS — R7303 Prediabetes: Secondary | ICD-10-CM | POA: Diagnosis not present

## 2022-10-21 DIAGNOSIS — R052 Subacute cough: Secondary | ICD-10-CM

## 2022-10-21 MED ORDER — CHLORPHENIRAMINE MALEATE 4 MG PO TABS: 4.0000 mg | ORAL_TABLET | Freq: Every day | ORAL | 0 refills | Status: DC

## 2022-10-21 MED ORDER — BENZONATATE 100 MG PO CAPS: 100.0000 mg | ORAL_CAPSULE | Freq: Three times a day (TID) | ORAL | 0 refills | Status: DC | PRN

## 2022-10-21 MED ORDER — METFORMIN HCL ER 500 MG PO TB24: 500.0000 mg | ORAL_TABLET | Freq: Every day | ORAL | 1 refills | Status: DC

## 2022-10-21 NOTE — Progress Notes (Signed)
Acute Office Visit  Subjective:     Patient ID: Tara Dhein, female    DOB: [birth year], 27 y.o.   MRN: [id number]  Chief Complaint  Patient presents with   Cough    Onset: 2 months --       Cough: Patient complains of nonproductive cough. For the past 2 days her allergies have also flared up - having some throat itching/irritation and drainage.  Symptoms began 2 months ago.  The cough is feeling like a tickle in her throat (reports daily heartburn - somewhat improved since starting Nexium) non-productive, without wheezing, dyspnea or hemoptysis, waxing and waning over time and is aggravated by exercise Associated symptoms include: none . Patient does not have new pets. Patient does not have a history of asthma. Patient does have a history of environmental allergens. Patient has not recent travel. Patient does have a history of smoking - socially as a teen. Patient  has not previous Chest X-ray. Symptoms initially started mid-January when entire household was sick with the Flu.      All review of systems negative except what is listed in the HPI      Objective:    BP (!) 140/80   Pulse 97   Temp 98.3 F (36.8 C)   Resp 18   Ht '5\' 3"'$  (1.6 m)   Wt (!) 337 lb (152.9 kg)   LMP 08/09/2022   SpO2 99%   BMI 59.70 kg/m    Physical Exam Vitals reviewed.  Constitutional:      Appearance: Normal appearance. She is obese.  HENT:     Head: Normocephalic and atraumatic.     Right Ear: Tympanic membrane normal.     Left Ear: Tympanic membrane normal.     Nose: Nose normal. No congestion or rhinorrhea.     Mouth/Throat:     Mouth: Mucous membranes are moist.     Pharynx: Oropharynx is clear.     Comments: Cobblestoning/PND Cardiovascular:     Rate and Rhythm: Normal rate and regular rhythm.     Pulses: Normal pulses.     Heart sounds: Normal heart sounds.  Pulmonary:     Effort: Pulmonary effort is normal.     Breath sounds: Normal breath sounds.   Musculoskeletal:     Cervical back: Normal range of motion and neck supple. No tenderness.  Lymphadenopathy:     Cervical: No cervical adenopathy.  Skin:    General: Skin is warm and dry.  Neurological:     Mental Status: She is alert and oriented to person, place, and time.  Psychiatric:        Mood and Affect: Mood normal.        Behavior: Behavior normal.        Thought Content: Thought content normal.        Judgment: Judgment normal.     No results found for any visits on 10/21/22.      Assessment & Plan:   Problem List Items Addressed This Visit       Other   Prediabetes Requesting to restart metformin.    Relevant Medications   metFORMIN (GLUCOPHAGE-XR) 500 MG 24 hr tablet   Other Visit Diagnoses     Subacute cough    -  Primary Likely multiple contributing factors - post-viral, allergic/drainage, and reflux Goal is to not cough or clear your throat.  Avoid coughing or clearing throat by using non-mint. products/sugarless candy, water, ice chips. Remember NO MINT PRODUCTS: Mucinex  DM 1-2 every 12 hrs or Delsym 2 tsp every 12 hrs for cough Tessalon Three times a day as needed for cough.  Nexium 30 min before breakfast or dinner.  Chlor tabs '4mg'$  2 at bedtime  for nasal drip until cough is 100% cough free.  Daily Flonase for allergies  If not improving with this regimen or if new symptoms develop, consider CXR and/or ABX trial.    Relevant Medications   chlorpheniramine (CHLOR-TRIMETON) 4 MG tablet   benzonatate (TESSALON) 100 MG capsule       Meds ordered this encounter  Medications   metFORMIN (GLUCOPHAGE-XR) 500 MG 24 hr tablet    Sig: Take 1 tablet (500 mg total) by mouth daily with breakfast.    Dispense:  90 tablet    Refill:  1    Order Specific Question:   Supervising Provider    Answer:   Penni Homans A [[id number]]   chlorpheniramine (CHLOR-TRIMETON) 4 MG tablet    Sig: Take 1 tablet (4 mg total) by mouth at bedtime.    Dispense:  14 tablet     Refill:  0    Order Specific Question:   Supervising Provider    Answer:   Penni Homans A [[id number]]   benzonatate (TESSALON) 100 MG capsule    Sig: Take 1 capsule (100 mg total) by mouth 3 (three) times daily as needed for cough.    Dispense:  30 capsule    Refill:  0    Order Specific Question:   Supervising Provider    Answer:   Penni Homans A [id number]    Return if symptoms worsen or fail to improve.  Terrilyn Saver, NP

## 2022-10-21 NOTE — Patient Instructions (Addendum)
We believe you have a chronic/cyclical cough that is aggravated by reflux, coughing, and drainage.  Goal is to not cough or clear your throat.  Avoid coughing or clearing throat by using non-mint. products/sugarless candy, water, ice chips. Remember NO MINT PRODUCTS: Mucinex DM 1-2 every 12 hrs or Delsym 2 tsp every 12 hrs for cough Tessalon Three times a day as needed for cough.  Nexium 30 min before breakfast or dinner.  Chlor tabs '4mg'$  2 at bedtime  for nasal drip until cough is 100% cough free.  Daily Flonase for allergies  Please contact office for follow-up if symptoms do not improve or worsen. Seek emergency care if symptoms become severe.

## 2022-11-01 MED ORDER — AZITHROMYCIN 250 MG PO TABS: ORAL_TABLET | ORAL | 0 refills | Status: AC

## 2022-11-03 ENCOUNTER — Ambulatory Visit (HOSPITAL_BASED_OUTPATIENT_CLINIC_OR_DEPARTMENT_OTHER)
Admission: RE | Admit: 2022-11-03 | Discharge: 2022-11-03 | Disposition: A | Discharge status: Home / Self Care | Payer: 59 | Source: Ambulatory Visit | Attending: Family Medicine | Admitting: Family Medicine

## 2022-11-03 ENCOUNTER — Encounter (HOSPITAL_BASED_OUTPATIENT_CLINIC_OR_DEPARTMENT_OTHER): Payer: Self-pay

## 2022-11-03 DIAGNOSIS — R052 Subacute cough: Secondary | ICD-10-CM | POA: Insufficient documentation

## 2022-12-02 ENCOUNTER — Encounter: Payer: Self-pay | Admitting: Family Medicine

## 2022-12-02 ENCOUNTER — Ambulatory Visit (INDEPENDENT_AMBULATORY_CARE_PROVIDER_SITE_OTHER): Payer: 59 | Admitting: Family Medicine

## 2022-12-02 VITALS — BP 132/70 | HR 98 | Temp 97.8°F | Ht 63.0 in | Wt 337.0 lb

## 2022-12-02 DIAGNOSIS — R350 Frequency of micturition: Secondary | ICD-10-CM

## 2022-12-02 DIAGNOSIS — R1030 Lower abdominal pain, unspecified: Secondary | ICD-10-CM | POA: Diagnosis not present

## 2022-12-02 LAB — POC URINALSYSI DIPSTICK (AUTOMATED)
Bilirubin, UA: NEGATIVE
Blood, UA: NEGATIVE
Glucose, UA: NEGATIVE
Leukocytes, UA: NEGATIVE
Nitrite, UA: NEGATIVE
Protein, UA: NEGATIVE
Spec Grav, UA: 1.015 (ref 1.010–1.025)
Urobilinogen, UA: 0.2 E.U./dL
pH, UA: 6.5 (ref 5.0–8.0)

## 2022-12-02 NOTE — Progress Notes (Signed)
Acute Office Visit  Subjective:     Patient ID: Tara Porter, female    DOB: September 23, 1995, 27 y.o.   MRN: [id number]  CC: urinary frequency    HPI   Urinary Tract Infection: Patient complains of  increased urinary frequency and urgency, generalized lower abdominal discomfort.  She has had symptoms for 2 days.   Patient does not have a history of recurrent UTI.  No back/flank pain, upper abdominal pain, hematuria, dysuria, fevers, chills, vaginal discomfort/discharge, nausea, vomiting, diarrhea.  She took a home Azo test that suggested UTI.  Some light spotting about a month ago. LMP prior to that was December. She is working on scheduling a GYN appointment. Periods have been about every 2 months or so for the past year.     ROS All review of systems negative except what is listed in the HPI      Objective:    BP 132/70   Pulse 98   Temp 97.8 F (36.6 C) (Oral)   Ht  (1.6 m)   Wt (!) 337 lb (152.9 kg)   LMP 08/09/2022 Comment: Irregular perios per pt due to PCOS  SpO2 97%   BMI 59.70 kg/m    Physical Exam Vitals reviewed.  Constitutional:      Appearance: Normal appearance.  Cardiovascular:     Rate and Rhythm: Normal rate and regular rhythm.     Pulses: Normal pulses.     Heart sounds: Normal heart sounds.  Pulmonary:     Effort: Pulmonary effort is normal.     Breath sounds: Normal breath sounds.  Abdominal:     General: There is no distension.     Tenderness: There is no abdominal tenderness. There is no right CVA tenderness, left CVA tenderness or guarding.  Skin:    General: Skin is warm and dry.  Neurological:     Mental Status: She is alert and oriented to person, place, and time.  Psychiatric:        Mood and Affect: Mood normal.        Behavior: Behavior normal.        Thought Content: Thought content normal.        Judgment: Judgment normal.         Results for orders placed or performed in visit on 12/02/22  POCT  Urinalysis Dipstick (Automated)  Result Value Ref Range   Color, UA yellow    Clarity, UA clear    Glucose, UA Negative Negative   Bilirubin, UA negative    Ketones, UA trace    Spec Grav, UA 1.015 1.010 - 1.025   Blood, UA negative    pH, UA 6.5 5.0 - 8.0   Protein, UA Negative Negative   Urobilinogen, UA 0.2 0.2 or 1.0 E.U./dL   Nitrite, UA negative    Leukocytes, UA Negative Negative        Assessment & Plan:   Problem List Items Addressed This Visit   None Visit Diagnoses     Lower abdominal pain    -  Primary   Relevant Orders   POCT Urinalysis Dipstick (Automated) (Completed)   Urine Culture   Urinary frequency       Relevant Orders   POCT Urinalysis Dipstick (Automated) (Completed)   Urine Culture     UA unremarkable - will send for culture. Encouraged hydration. Will update her with results and plan. She is following with urology for OAB already. She is also planning to get in  with GYN for irregular periods. Advised that if their wait time is long, current symptoms are not explained by urine culture or menstruation, or any new symptoms develop, follow-up here so we can continue workup.  Patient aware of signs/symptoms requiring further/urgent evaluation.     No orders of the defined types were placed in this encounter.   Return if symptoms worsen or fail to improve.  Clayborne Dana, NP

## 2022-12-03 LAB — URINE CULTURE
MICRO NUMBER:: 14843109
SPECIMEN QUALITY:: ADEQUATE

## 2022-12-22 ENCOUNTER — Encounter: Payer: Self-pay | Admitting: Nurse Practitioner

## 2022-12-22 ENCOUNTER — Ambulatory Visit (INDEPENDENT_AMBULATORY_CARE_PROVIDER_SITE_OTHER): Payer: 59 | Admitting: Nurse Practitioner

## 2022-12-22 VITALS — BP 136/82 | HR 99 | Temp 98.4°F | Ht 63.0 in | Wt 340.0 lb

## 2022-12-22 DIAGNOSIS — R351 Nocturia: Secondary | ICD-10-CM

## 2022-12-22 DIAGNOSIS — E282 Polycystic ovarian syndrome: Secondary | ICD-10-CM

## 2022-12-22 DIAGNOSIS — E662 Morbid (severe) obesity with alveolar hypoventilation: Secondary | ICD-10-CM

## 2022-12-22 DIAGNOSIS — Z6841 Body Mass Index (BMI) 40.0 and over, adult: Secondary | ICD-10-CM

## 2022-12-22 DIAGNOSIS — G4730 Sleep apnea, unspecified: Secondary | ICD-10-CM | POA: Insufficient documentation

## 2022-12-22 DIAGNOSIS — G4719 Other hypersomnia: Secondary | ICD-10-CM

## 2022-12-22 DIAGNOSIS — R0683 Snoring: Secondary | ICD-10-CM | POA: Insufficient documentation

## 2022-12-22 NOTE — Progress Notes (Signed)
@Patient  ID: Tara Porter, female    DOB: [birth year], 27 y.o.   MRN: [id number]  Chief Complaint  Patient presents with   Consult    Daytime time sleepiness, pt occ snores,     Referring provider: Clayborne Dana, NP  HPI: 27 year old female, never smoker referred for sleep consult.  Past medical history significant for PCOS, chronic anemia, anxiety, prediabetes, severe obesity.  TEST/EVENTS:   12/22/2022: Today-sleep consult Patient resents today for consult, referred by Hyman Hopes, NP.  She had been discussing her issues with nocturia and was recommended to have evaluation for sleep apnea.  She tells me that she has had trouble with her sleep for a long time but has gotten worse since she gained 100 pounds over the last year.  She has restless sleep at night and wakes up frequently throughout the night.  Has to urinate often.  Wakes feeling tired in the morning.  She has daytime fatigue.  Her husband has told her that she snores at night.  Has never said that she stops breathing when she sleeps.  She does have morning headaches and dry mouth.  Occasionally has some sleep talking.  Denies any sleepwalking, sleep paralysis, drowsy driving.  No history of narcolepsy or symptoms of cataplexy.  She has been struggling with weight gain and has trouble losing it especially with her PCOS.  She was recently diagnosed with prediabetes.  She has never had endocrinology evaluation.  She is currently on metformin and birth control to try to better manage her PCOS but she is not sure if it is helping.  Would like to know if she can do anything else or see a specialist. She goes to bed between 10 PM and 1 AM.  Falls asleep within 30 minutes.  Wakes 3-5 times at night.  She gets up around 6 to 8 AM.  Does not operate any heavy machinery other than a car.  No previous sleep study. No history of cardiac disease or stroke.  She is never smoker.  Does not drink any alcohol.  No excessive caffeine intake.   Lives with her husband and daughter.  She is a stay-at-home mother.  Family history of diabetes.  Epworth 6  Allergies  Allergen Reactions   Amoxicillin Hives    Has patient had a PCN reaction causing immediate rash, facial/tongue/throat swelling, SOB or lightheadedness with hypotension: no Has patient had a PCN reaction causing severe rash involving mucus membranes or skin necrosis: no Has patient had a PCN reaction that required hospitalizationno Has patient had a PCN reaction occurring within the last 10 years: no If all of the above answers are "NO", then may proceed with Cephalosporin use.      Immunization History  Administered Date(s) Administered   Tdap 10/17/2017    Past Medical History:  Diagnosis Date   Anxiety    Gestational diabetes    PCOS (polycystic ovarian syndrome)    PCOS (polycystic ovarian syndrome)    Prediabetes    UTI (lower urinary tract infection)     Tobacco History: Social History   Tobacco Use  Smoking Status Never  Smokeless Tobacco Never   Counseling given: Not Answered   Outpatient Medications Prior to Visit  Medication Sig Dispense Refill   metFORMIN (GLUCOPHAGE-XR) 500 MG 24 hr tablet Take 1 tablet (500 mg total) by mouth daily with breakfast. 90 tablet 1   solifenacin (VESICARE) 10 MG tablet Take 1 tablet (10 mg total) by mouth daily. 90 tablet  3   No facility-administered medications prior to visit.     Review of Systems:   Constitutional: No night sweats, fevers, chills, or lassitude. +excessive daytime fatigue, weight gain  HEENT: No difficulty swallowing, tooth/dental problems, or sore throat. No sneezing, itching, ear ache, nasal congestion, or post nasal drip. +AM headaches and dry mouth CV:  No chest pain, orthopnea, PND, swelling in lower extremities, anasarca, dizziness, palpitations, syncope Resp: +snoring. No shortness of breath with exertion or at rest. No excess mucus or change in color of mucus. No productive or  non-productive. No hemoptysis. No wheezing.  No chest wall deformity GI:  +occasional heartburn, indigestion. No abdominal pain, nausea, vomiting, diarrhea, change in bowel habits, loss of appetite, bloody stools.  GU: No dysuria, change in color of urine, urgency. +nocturia Skin: No rash, lesions, ulcerations MSK:  No joint pain or swelling.  No decreased range of motion.  No back pain. Neuro: No dizziness or lightheadedness.  Psych: +depression, anxiety (stable). No SI/HI. Mood stable. +sleep disturbance    Physical Exam:  BP 136/82   Pulse 99   Temp 98.4 F (36.9 C) (Oral)   Ht 5\' 3"  (1.6 m)   Wt (!) 340 lb (154.2 kg)   SpO2 99%   BMI 60.23 kg/m   GEN: Pleasant, interactive, well-appearing; morbidly obese; in no acute distress. HEENT:  Normocephalic and atraumatic. PERRLA. Sclera white. Nasal turbinates pink, moist and patent bilaterally. No rhinorrhea present. Oropharynx pink and moist, without exudate or edema. No lesions, ulcerations, or postnasal drip. Mallampati III NECK:  Supple w/ fair ROM. No JVD present. Normal carotid impulses w/o bruits. Thyroid symmetrical with no goiter or nodules palpated. No lymphadenopathy.   CV: RRR, no m/r/g, no peripheral edema. Pulses intact, +2 bilaterally. No cyanosis, pallor or clubbing. PULMONARY:  Unlabored, regular breathing. Clear bilaterally A&P w/o wheezes/rales/rhonchi. No accessory muscle use.  GI: BS present and normoactive. Soft, non-tender to palpation. No organomegaly or masses detected.  MSK: No erythema, warmth or tenderness. Cap refil <2 sec all extrem. No deformities or joint swelling noted.  Neuro: A/Ox3. No focal deficits noted.   Skin: Warm, no lesions or rashe Psych: Normal affect and behavior. Judgement and thought content appropriate.     Lab Results:  CBC    Component Value Date/Time   WBC 9.6 08/05/2022 1413   RBC 4.85 08/05/2022 1413   HGB 12.4 08/05/2022 1413   HCT 37.8 08/05/2022 1413   PLT 426.0 (H)  08/05/2022 1413   MCV 78.0 08/05/2022 1413   MCH 26.6 10/20/2017 1329   MCHC 32.7 08/05/2022 1413   RDW 14.3 08/05/2022 1413   LYMPHSABS 2.6 08/05/2022 1413   MONOABS 0.4 08/05/2022 1413   EOSABS 0.1 08/05/2022 1413   BASOSABS 0.1 08/05/2022 1413    BMET    Component Value Date/Time   NA 138 08/05/2022 1413   K 4.0 08/05/2022 1413   CL 101 08/05/2022 1413   CO2 28 08/05/2022 1413   GLUCOSE 79 08/05/2022 1413   BUN 9 08/05/2022 1413   CREATININE 0.61 08/05/2022 1413   CALCIUM 8.8 08/05/2022 1413   GFRNONAA >60 10/16/2017 1311   GFRAA >60 10/16/2017 1311    BNP No results found for: "BNP"   Imaging:  No results found.        No data to display          No results found for: "NITRICOXIDE"      Assessment & Plan:   Excessive daytime sleepiness She has  snoring, excessive daytime sleepiness, restless sleep, morning headaches. BMI 60. History of prediabetes. Epworth 6. Given this,  I am concerned she could have sleep disordered breathing with obstructive sleep apnea as well as obesity hypoventilation syndrome. She will need an in lab sleep study for further evaluation.    - discussed how weight can impact sleep and risk for sleep disordered breathing - discussed options to assist with weight loss: combination of diet modification, cardiovascular and strength training exercises   - had an extensive discussion regarding the adverse health consequences related to untreated sleep disordered breathing - specifically discussed the risks for hypertension, coronary artery disease, cardiac dysrhythmias, cerebrovascular disease, and diabetes - lifestyle modification discussed   - discussed how sleep disruption can increase risk of accidents, particularly when driving - safe driving practices were discussed  Patient Instructions  Given your symptoms, I am concerned that you may have sleep disordered breathing with sleep apnea. You will need a sleep study for further  evaluation. Someone will contact you to schedule this.   We discussed how untreated sleep apnea puts an individual at risk for cardiac arrhthymias, pulm HTN, DM, stroke and increases their risk for daytime accidents. We also briefly reviewed treatment options including weight loss, side sleeping position, oral appliance, CPAP therapy or referral to ENT for possible surgical options  Use caution when driving and pull over if you become sleepy.  Referral to endocrinology to discuss your PCOS and weight gain   Follow up in 6 weeks with Florentina Addison Jenkins Risdon,NP to go over sleep study results, or sooner, if needed     Class 3 severe obesity with body mass index (BMI) of 60.0 to 69.9 in adult (HCC) BMI 60. Increased weight of 100 lb over the last year. She struggles with weight loss due to PCOS. She has new diagnosis of prediabetes. She has never seen endocrinology. Referral placed today for PCOS management and weight loss eval.   PCOS (polycystic ovarian syndrome) See above  Nocturia Possibly due to untreated OSA/OHS. See above.   Loud snoring See above.    I spent 45 minutes of dedicated to the care of this patient on the date of this encounter to include pre-visit review of records, face-to-face time with the patient discussing conditions above, post visit ordering of testing, clinical documentation with the electronic health record, making appropriate referrals as documented, and communicating necessary findings to members of the patients care team.  Noemi Chapel, NP 12/22/2022  Pt aware and understands NP's role.

## 2022-12-22 NOTE — Patient Instructions (Addendum)
Given your symptoms, I am concerned that you may have sleep disordered breathing with sleep apnea. You will need a sleep study for further evaluation. Someone will contact you to schedule this.   We discussed how untreated sleep apnea puts an individual at risk for cardiac arrhthymias, pulm HTN, DM, stroke and increases their risk for daytime accidents. We also briefly reviewed treatment options including weight loss, side sleeping position, oral appliance, CPAP therapy or referral to ENT for possible surgical options  Use caution when driving and pull over if you become sleepy.  Referral to endocrinology to discuss your PCOS and weight gain   Follow up in 6 weeks with Katie Ji Feldner,NP to go over sleep study results, or sooner, if needed

## 2022-12-22 NOTE — Assessment & Plan Note (Signed)
She has snoring, excessive daytime sleepiness, restless sleep, morning headaches. BMI 60. History of prediabetes. Epworth 6. Given this,  I am concerned she could have sleep disordered breathing with obstructive sleep apnea as well as obesity hypoventilation syndrome. She will need an in lab sleep study for further evaluation.    - discussed how weight can impact sleep and risk for sleep disordered breathing - discussed options to assist with weight loss: combination of diet modification, cardiovascular and strength training exercises   - had an extensive discussion regarding the adverse health consequences related to untreated sleep disordered breathing - specifically discussed the risks for hypertension, coronary artery disease, cardiac dysrhythmias, cerebrovascular disease, and diabetes - lifestyle modification discussed   - discussed how sleep disruption can increase risk of accidents, particularly when driving - safe driving practices were discussed  Patient Instructions  Given your symptoms, I am concerned that you may have sleep disordered breathing with sleep apnea. You will need a sleep study for further evaluation. Someone will contact you to schedule this.   We discussed how untreated sleep apnea puts an individual at risk for cardiac arrhthymias, pulm HTN, DM, stroke and increases their risk for daytime accidents. We also briefly reviewed treatment options including weight loss, side sleeping position, oral appliance, CPAP therapy or referral to ENT for possible surgical options  Use caution when driving and pull over if you become sleepy.  Referral to endocrinology to discuss your PCOS and weight gain   Follow up in 6 weeks with Katie Alexsander Cavins,NP to go over sleep study results, or sooner, if needed

## 2022-12-22 NOTE — Assessment & Plan Note (Signed)
See above

## 2022-12-22 NOTE — Assessment & Plan Note (Signed)
Possibly due to untreated OSA/OHS. See above.

## 2022-12-22 NOTE — Assessment & Plan Note (Addendum)
BMI 60. Increased weight of 100 lb over the last year. She struggles with weight loss due to PCOS. She has new diagnosis of prediabetes. She has never seen endocrinology. Referral placed today for PCOS management and weight loss eval.

## 2022-12-22 NOTE — Assessment & Plan Note (Signed)
>>  ASSESSMENT AND PLAN FOR EXCESSIVE DAYTIME SLEEPINESS WRITTEN ON 12/22/2022  4:50 PM BY COBB, Braxton V, NP  She has snoring, excessive daytime sleepiness, restless sleep, morning headaches. BMI 60. History of prediabetes. Epworth 6. Given this,  I am concerned she could have sleep disordered breathing with obstructive sleep apnea as well as obesity hypoventilation syndrome. She will need an in lab sleep study for further evaluation.    - discussed how weight can impact sleep and risk for sleep disordered breathing - discussed options to assist with weight loss: combination of diet modification, cardiovascular and strength training exercises   - had an extensive discussion regarding the adverse health consequences related to untreated sleep disordered breathing - specifically discussed the risks for hypertension, coronary artery disease, cardiac dysrhythmias, cerebrovascular disease, and diabetes - lifestyle modification discussed   - discussed how sleep disruption can increase risk of accidents, particularly when driving - safe driving practices were discussed  Patient Instructions  Given your symptoms, I am concerned that you may have sleep disordered breathing with sleep apnea. You will need a sleep study for further evaluation. Someone will contact you to schedule this.   We discussed how untreated sleep apnea puts an individual at risk for cardiac arrhthymias, pulm HTN, DM, stroke and increases their risk for daytime accidents. We also briefly reviewed treatment options including weight loss, side sleeping position, oral appliance, CPAP therapy or referral to ENT for possible surgical options  Use caution when driving and pull over if you become sleepy.  Referral to endocrinology to discuss your PCOS and weight gain   Follow up in 6 weeks with Katie Cobb,NP to go over sleep study results, or sooner, if needed

## 2023-01-20 ENCOUNTER — Ambulatory Visit: Payer: 59 | Admitting: Family Medicine

## 2023-01-31 ENCOUNTER — Telehealth: Payer: Self-pay | Admitting: Family Medicine

## 2023-01-31 NOTE — Telephone Encounter (Signed)
Patient called still has pain from UTI but was seen in ER at [facility]. They told her to contact PCP if still feels the pain .   Please advise

## 2023-01-31 NOTE — Telephone Encounter (Signed)
Will need appointment for re-evaluation. Thanks.

## 2023-02-02 ENCOUNTER — Ambulatory Visit: Payer: 59 | Admitting: Nurse Practitioner

## 2023-02-02 NOTE — Telephone Encounter (Signed)
Left VM to schedule a follow up appt.

## 2023-02-08 ENCOUNTER — Ambulatory Visit (INDEPENDENT_AMBULATORY_CARE_PROVIDER_SITE_OTHER): Payer: 59 | Admitting: Family Medicine

## 2023-02-08 VITALS — BP 107/59 | HR 84 | Temp 97.4°F | Ht 63.0 in | Wt 334.0 lb

## 2023-02-08 DIAGNOSIS — R102 Pelvic and perineal pain: Secondary | ICD-10-CM | POA: Diagnosis not present

## 2023-02-08 DIAGNOSIS — R7989 Other specified abnormal findings of blood chemistry: Secondary | ICD-10-CM

## 2023-02-08 DIAGNOSIS — R3915 Urgency of urination: Secondary | ICD-10-CM | POA: Diagnosis not present

## 2023-02-08 DIAGNOSIS — R739 Hyperglycemia, unspecified: Secondary | ICD-10-CM | POA: Diagnosis not present

## 2023-02-08 LAB — CBC WITH DIFFERENTIAL/PLATELET
Basophils Absolute: 0 10*3/uL (ref 0.0–0.1)
Basophils Relative: 0.2 % (ref 0.0–3.0)
Eosinophils Absolute: 0.1 10*3/uL (ref 0.0–0.7)
Eosinophils Relative: 1 % (ref 0.0–5.0)
HCT: 39.4 % (ref 36.0–46.0)
Hemoglobin: 12.5 g/dL (ref 12.0–15.0)
Lymphocytes Relative: 24.1 % (ref 12.0–46.0)
Lymphs Abs: 2.5 10*3/uL (ref 0.7–4.0)
MCHC: 31.7 g/dL (ref 30.0–36.0)
MCV: 78.7 fl (ref 78.0–100.0)
Monocytes Absolute: 0.3 10*3/uL (ref 0.1–1.0)
Monocytes Relative: 3.1 % (ref 3.0–12.0)
Neutro Abs: 7.5 10*3/uL (ref 1.4–7.7)
Neutrophils Relative %: 71.6 % (ref 43.0–77.0)
Platelets: 457 10*3/uL — ABNORMAL HIGH (ref 150.0–400.0)
RBC: 5.01 Mil/uL (ref 3.87–5.11)
RDW: 15 % (ref 11.5–15.5)
WBC: 10.4 10*3/uL (ref 4.0–10.5)

## 2023-02-08 LAB — COMPREHENSIVE METABOLIC PANEL
ALT: 22 U/L (ref 0–35)
AST: 17 U/L (ref 0–37)
Albumin: 4.1 g/dL (ref 3.5–5.2)
Alkaline Phosphatase: 57 U/L (ref 39–117)
BUN: 10 mg/dL (ref 6–23)
CO2: 27 mEq/L (ref 19–32)
Calcium: 9.2 mg/dL (ref 8.4–10.5)
Chloride: 101 mEq/L (ref 96–112)
Creatinine, Ser: 0.66 mg/dL (ref 0.40–1.20)
GFR: 118.69 mL/min (ref >60.00)
Glucose, Bld: 84 mg/dL (ref 70–99)
Potassium: 4.2 mEq/L (ref 3.5–5.1)
Sodium: 136 mEq/L (ref 135–145)
Total Bilirubin: 0.5 mg/dL (ref 0.2–1.2)
Total Protein: 7.3 g/dL (ref 6.0–8.3)

## 2023-02-08 LAB — POC URINALSYSI DIPSTICK (AUTOMATED)
Bilirubin, UA: NEGATIVE
Blood, UA: NEGATIVE
Glucose, UA: NEGATIVE
Ketones, UA: NEGATIVE
Leukocytes, UA: NEGATIVE
Nitrite, UA: NEGATIVE
Protein, UA: NEGATIVE
Spec Grav, UA: 1.01 (ref 1.010–1.025)
Urobilinogen, UA: 0.2 E.U./dL
pH, UA: 6 (ref 5.0–8.0)

## 2023-02-08 LAB — URINALYSIS, MICROSCOPIC ONLY

## 2023-02-08 LAB — POCT PREGNANCY, URINE: Pregnancy Test, Urine: NEGATIVE

## 2023-02-08 LAB — HEMOGLOBIN A1C: Hgb A1c MFr Bld: 6.1 % (ref 4.6–6.5)

## 2023-02-08 NOTE — Progress Notes (Signed)
Acute Office Visit  Subjective:     Patient ID: Tara Porter, female    DOB: January 20, 1996, 27 y.o.   MRN: [id number]  Chief Complaint  Patient presents with   Urinary Urgency    HPI Patient is in today for urgency and pelvic pain.   Discussed the use of AI scribe software for clinical note transcription with the patient, who gave verbal consent to proceed.  History of Present Illness   The patient, with a history of polycystic ovary syndrome (PCOS), presents with urinary concerns and irregular vaginal bleeding. She was recently treated for a urinary tract infection with cephalexin after presenting to the emergency department with severe vaginal bleeding and urinary urgency. The bleeding was not associated with a menstrual cycle and was accompanied by the sensation of needing to 'push something.' Since the ED visit, the patient has experienced two more episodes of vaginal bleeding, each lasting a few days.  The patient also reports persistent lower pelvic pain, which she describes as a 'throb.' The pain is not localized to one side and has been severe enough to cause nausea and vomiting. The patient also reports discomfort after sexual intercourse. She is planning to call her OBGYN to schedule an appointment.  The patient's PCOS has been managed with metformin, and her menstrual cycles have been regular until the recent episodes of bleeding. She is not currently on birth control and is not sexually active. The patient is also prediabetic and is scheduled to see an endocrinologist next month.           ROS All review of systems negative except what is listed in the HPI      Objective:    BP (!) 107/59   Pulse 84   Temp (!) 97.4 F (36.3 C) (Oral)   Ht 5\' 3"  (1.6 m)   Wt (!) 334 lb (151.5 kg)   SpO2 97%   BMI 59.17 kg/m    Physical Exam Vitals reviewed.  Constitutional:      Appearance: Normal appearance. She is obese.  Cardiovascular:     Rate and Rhythm:  Normal rate and regular rhythm.     Pulses: Normal pulses.     Heart sounds: Normal heart sounds.  Pulmonary:     Effort: Pulmonary effort is normal.     Breath sounds: Normal breath sounds.  Abdominal:     Tenderness: There is abdominal tenderness in the right lower quadrant, suprapubic area and left lower quadrant. There is no guarding or rebound.  Skin:    General: Skin is warm and dry.  Neurological:     Mental Status: She is alert and oriented to person, place, and time.  Psychiatric:        Mood and Affect: Mood normal.        Behavior: Behavior normal.        Thought Content: Thought content normal.        Judgment: Judgment normal.     Results for orders placed or performed in visit on 02/08/23  POCT Urinalysis Dipstick (Automated)  Result Value Ref Range   Color, UA yellow    Clarity, UA clear    Glucose, UA Negative Negative   Bilirubin, UA negative    Ketones, UA negative    Spec Grav, UA 1.010 1.010 - 1.025   Blood, UA negative    pH, UA 6.0 5.0 - 8.0   Protein, UA Negative Negative   Urobilinogen, UA 0.2 0.2 or 1.0 E.U./dL  Nitrite, UA negative    Leukocytes, UA Negative Negative  POCT Pregnancy, Urine  Result Value Ref Range   Pregnancy Test, Urine negative         Assessment & Plan:   Problem List Items Addressed This Visit   None Visit Diagnoses     Urinary urgency    -  Primary   Relevant Orders   POCT Urinalysis Dipstick (Automated) (Completed)   Urine Culture   Urine Microscopic   Pelvic pain       Relevant Orders   Comprehensive metabolic panel   CBC with Differential/Platelet   CT Abdomen Pelvis W Contrast   POCT Pregnancy, Urine (Completed)   Hyperglycemia       Relevant Orders   Hemoglobin A1c       No orders of the defined types were placed in this encounter.   No follow-ups on file.  Clayborne Dana, NP

## 2023-02-09 ENCOUNTER — Ambulatory Visit (INDEPENDENT_AMBULATORY_CARE_PROVIDER_SITE_OTHER): Payer: 59 | Admitting: Urology

## 2023-02-09 ENCOUNTER — Encounter: Payer: Self-pay | Admitting: Urology

## 2023-02-09 VITALS — BP 143/95 | HR 102 | Ht 63.0 in | Wt 330.0 lb

## 2023-02-09 DIAGNOSIS — N3281 Overactive bladder: Secondary | ICD-10-CM

## 2023-02-09 DIAGNOSIS — N398 Other specified disorders of urinary system: Secondary | ICD-10-CM

## 2023-02-09 DIAGNOSIS — R39198 Other difficulties with micturition: Secondary | ICD-10-CM

## 2023-02-09 LAB — BLADDER SCAN AMB NON-IMAGING

## 2023-02-09 LAB — URINALYSIS, ROUTINE W REFLEX MICROSCOPIC
Bilirubin, UA: NEGATIVE
Glucose, UA: NEGATIVE
Ketones, UA: NEGATIVE
Leukocytes,UA: NEGATIVE
Nitrite, UA: NEGATIVE
Protein,UA: NEGATIVE
Specific Gravity, UA: 1.025 (ref 1.005–1.030)
Urobilinogen, Ur: 0.2 mg/dL (ref 0.2–1.0)
pH, UA: 7 (ref 5.0–7.5)

## 2023-02-09 LAB — MICROSCOPIC EXAMINATION
Cast Type: NONE SEEN
Casts: NONE SEEN /lpf
Crystal Type: NONE SEEN
Crystals: NONE SEEN
Renal Epithel, UA: NONE SEEN /hpf
Trichomonas, UA: NONE SEEN
Yeast, UA: NONE SEEN

## 2023-02-09 LAB — URINE CULTURE
MICRO NUMBER:: 15124521
SPECIMEN QUALITY:: ADEQUATE

## 2023-02-09 NOTE — Addendum Note (Signed)
Addended by: Hyman Hopes B on: 02/09/2023 04:07 PM   Modules accepted: Orders

## 2023-02-09 NOTE — Progress Notes (Signed)
Assessment: 1. Voiding dysfunction     Plan: Today I had a long discussion with the patient regarding her longstanding bothersome voiding symptoms that have not responded to anticholinergics.  We did discuss empiric trial of a beta agonist today.  We also discussed further evaluation with urodynamics and subspecialty referral.  Her symptoms have really impacted negatively her quality of life. Following our discussion, we will refer patient to Dr. Alfredo Martinez at [facility] for further evaluation and treatment.  Chief Complaint: Chief Complaint  Patient presents with   Over Active Bladder    HPI: Tara Porter is a 27 y.o. female who presents for continued evaluation of voiding dysfunction. Please see my note 10/07/2022 at the time of initial visit for detailed history.  Patient has longstanding-essentially lifelong bladder issues with OAB symptoms as well as nocturnal enuresis.  Several years ago she responded fairly well to [facility] and we started her on that in February 2024.  Unfortunately she states there has been very little improvement in her symptoms with vesicare and behavorial strategies.  UA today=neg PVR= 9ml     Portions of the above documentation were copied from a prior visit for review purposes only.  Allergies: Allergies  Allergen Reactions   Amoxicillin Hives    Has patient had a PCN reaction causing immediate rash, facial/tongue/throat swelling, SOB or lightheadedness with hypotension: no Has patient had a PCN reaction causing severe rash involving mucus membranes or skin necrosis: no Has patient had a PCN reaction that required hospitalizationno Has patient had a PCN reaction occurring within the last 10 years: no If all of the above answers are "NO", then may proceed with Cephalosporin use.      PMH: Past Medical History:  Diagnosis Date   Anxiety    Gestational diabetes    PCOS (polycystic ovarian syndrome)    PCOS (polycystic  ovarian syndrome)    Prediabetes    UTI (lower urinary tract infection)     PSH: Past Surgical History:  Procedure Laterality Date   CESAREAN SECTION N/A 10/16/2017   Procedure: CESAREAN SECTION;  Surgeon: Maxie Better, MD;  Location: [facility];  Service: Obstetrics;  Laterality: N/A;   TONSILLECTOMY      SH: Social History   Tobacco Use   Smoking status: Never   Smokeless tobacco: Never  Substance Use Topics   Alcohol use: No   Drug use: No    ROS: Constitutional:  Negative for fever, chills, weight loss CV: Negative for chest pain, previous MI, hypertension Respiratory:  Negative for shortness of breath, wheezing, sleep apnea, frequent cough GI:  Negative for nausea, vomiting, bloody stool, GERD  PE: BP (!) 143/95   Pulse (!) 102   Ht 5\' 3"  (1.6 m)   Wt (!) 330 lb (149.7 kg)   BMI 58.46 kg/m  GENERAL APPEARANCE:  Well appearing, well developed, well nourished, NAD    Results: Results for orders placed or performed in visit on 02/09/23 (from the past 24 hour(s))  Bladder Scan (Post Void Residual) in office   Collection Time: 02/09/23 10:11 AM  Result Value Ref Range   Scan Result 9ml   Results for orders placed or performed in visit on 02/08/23 (from the past 24 hour(s))  POCT Urinalysis Dipstick (Automated)   Collection Time: 02/08/23 11:45 AM  Result Value Ref Range   Color, UA yellow    Clarity, UA clear    Glucose, UA Negative Negative   Bilirubin, UA negative  Ketones, UA negative    Spec Grav, UA 1.010 1.010 - 1.025   Blood, UA negative    pH, UA 6.0 5.0 - 8.0   Protein, UA Negative Negative   Urobilinogen, UA 0.2 0.2 or 1.0 E.U./dL   Nitrite, UA negative    Leukocytes, UA Negative Negative  Urine Microscopic   Collection Time: 02/08/23 12:26 PM  Result Value Ref Range   WBC, UA 0-2/hpf 0-2/hpf   RBC / HPF 0-2/hpf 0-2/hpf   Mucus, UA Presence of (A) None   Squamous Epithelial / HPF Rare(0-4/hpf) Rare(0-4/hpf)   Bacteria, UA  Rare(<10/hpf) (A) None  Comprehensive metabolic panel   Collection Time: 02/08/23 12:26 PM  Result Value Ref Range   Sodium 136 135 - 145 mEq/L   Potassium 4.2 3.5 - 5.1 mEq/L   Chloride 101 96 - 112 mEq/L   CO2 27 19 - 32 mEq/L   Glucose, Bld 84 70 - 99 mg/dL   BUN 10 6 - 23 mg/dL   Creatinine, Ser 1.61 0.40 - 1.20 mg/dL   Total Bilirubin 0.5 0.2 - 1.2 mg/dL   Alkaline Phosphatase 57 39 - 117 U/L   AST 17 0 - 37 U/L   ALT 22 0 - 35 U/L   Total Protein 7.3 6.0 - 8.3 g/dL   Albumin 4.1 3.5 - 5.2 g/dL   GFR 096.04 >54.09 mL/min   Calcium 9.2 8.4 - 10.5 mg/dL  CBC with Differential/Platelet   Collection Time: 02/08/23 12:26 PM  Result Value Ref Range   WBC 10.4 4.0 - 10.5 K/uL   RBC 5.01 3.87 - 5.11 Mil/uL   Hemoglobin 12.5 12.0 - 15.0 g/dL   HCT 81.1 91.4 - 78.2 %   MCV 78.7 78.0 - 100.0 fl   MCHC 31.7 30.0 - 36.0 g/dL   RDW 95.6 21.3 - 08.6 %   Platelets 457.0 (H) 150.0 - 400.0 K/uL   Neutrophils Relative % 71.6 43.0 - 77.0 %   Lymphocytes Relative 24.1 12.0 - 46.0 %   Monocytes Relative 3.1 3.0 - 12.0 %   Eosinophils Relative 1.0 0.0 - 5.0 %   Basophils Relative 0.2 0.0 - 3.0 %   Neutro Abs 7.5 1.4 - 7.7 K/uL   Lymphs Abs 2.5 0.7 - 4.0 K/uL   Monocytes Absolute 0.3 0.1 - 1.0 K/uL   Eosinophils Absolute 0.1 0.0 - 0.7 K/uL   Basophils Absolute 0.0 0.0 - 0.1 K/uL  Hemoglobin A1c   Collection Time: 02/08/23 12:26 PM  Result Value Ref Range   Hgb A1c MFr Bld 6.1 4.6 - 6.5 %  POCT Pregnancy, Urine   Collection Time: 02/08/23 12:30 PM  Result Value Ref Range   Pregnancy Test, Urine negative

## 2023-02-12 ENCOUNTER — Ambulatory Visit (HOSPITAL_BASED_OUTPATIENT_CLINIC_OR_DEPARTMENT_OTHER)
Admission: RE | Admit: 2023-02-12 | Discharge: 2023-02-12 | Disposition: A | Discharge status: Home / Self Care | Payer: 59 | Source: Ambulatory Visit | Attending: Family Medicine | Admitting: Family Medicine

## 2023-02-12 DIAGNOSIS — R102 Pelvic and perineal pain: Secondary | ICD-10-CM | POA: Diagnosis present

## 2023-02-12 MED ORDER — IOHEXOL 300 MG/ML  SOLN
100.0000 mL | Freq: Once | INTRAMUSCULAR | Status: AC | PRN
  Administered 2023-02-12: 100 mL via INTRAVENOUS

## 2023-02-15 ENCOUNTER — Encounter: Payer: Self-pay | Admitting: Family Medicine

## 2023-02-25 ENCOUNTER — Other Ambulatory Visit (INDEPENDENT_AMBULATORY_CARE_PROVIDER_SITE_OTHER): Payer: 59

## 2023-02-25 DIAGNOSIS — E611 Iron deficiency: Secondary | ICD-10-CM

## 2023-02-25 DIAGNOSIS — R7989 Other specified abnormal findings of blood chemistry: Secondary | ICD-10-CM | POA: Diagnosis not present

## 2023-02-25 LAB — CBC WITH DIFFERENTIAL/PLATELET
Absolute Monocytes: 273 cells/uL (ref 200–950)
Basophils Absolute: 19 cells/uL (ref 0–200)
Basophils Relative: 0.3 %
Eosinophils Absolute: 143 cells/uL (ref 15–500)
Eosinophils Relative: 2.3 %
HCT: 39.2 % (ref 35.0–45.0)
Hemoglobin: 12.5 g/dL (ref 11.7–15.5)
Lymphs Abs: 1854 cells/uL (ref 850–3900)
MCH: 25.1 pg — ABNORMAL LOW (ref 27.0–33.0)
MCHC: 31.9 g/dL — ABNORMAL LOW (ref 32.0–36.0)
MCV: 78.7 fL — ABNORMAL LOW (ref 80.0–100.0)
MPV: 9.2 fL (ref 7.5–12.5)
Monocytes Relative: 4.4 %
Neutro Abs: 3912 cells/uL (ref 1500–7800)
Neutrophils Relative %: 63.1 %
Platelets: 376 10*3/uL (ref 140–400)
RBC: 4.98 10*6/uL (ref 3.80–5.10)
RDW: 14.1 % (ref 11.0–15.0)
Total Lymphocyte: 29.9 %
WBC: 6.2 10*3/uL (ref 3.8–10.8)

## 2023-02-25 LAB — IBC + FERRITIN
Ferritin: 34.3 ng/mL (ref 10.0–291.0)
Iron: 38 ug/dL — ABNORMAL LOW (ref 42–145)
Saturation Ratios: 11.1 % — ABNORMAL LOW (ref 20.0–50.0)
TIBC: 343 ug/dL (ref 250.0–450.0)
Transferrin: 245 mg/dL (ref 212.0–360.0)

## 2023-02-25 NOTE — Addendum Note (Signed)
Addended by: Maximino Sarin on: 02/25/2023 08:39 AM   Modules accepted: Orders

## 2023-02-28 ENCOUNTER — Ambulatory Visit (HOSPITAL_BASED_OUTPATIENT_CLINIC_OR_DEPARTMENT_OTHER): Payer: 59 | Admitting: Pulmonary Disease

## 2023-02-28 LAB — PATHOLOGIST SMEAR REVIEW

## 2023-02-28 NOTE — Addendum Note (Signed)
Addended by: Hyman Hopes B on: 02/28/2023 04:55 PM   Modules accepted: Orders

## 2023-03-10 ENCOUNTER — Other Ambulatory Visit: Payer: 59

## 2023-03-16 ENCOUNTER — Ambulatory Visit: Payer: 59 | Admitting: "Endocrinology

## 2023-03-24 ENCOUNTER — Encounter (HOSPITAL_BASED_OUTPATIENT_CLINIC_OR_DEPARTMENT_OTHER): Payer: 59 | Admitting: Pulmonary Disease

## 2023-04-01 ENCOUNTER — Other Ambulatory Visit: Payer: Self-pay | Admitting: Family Medicine

## 2023-04-01 DIAGNOSIS — R7303 Prediabetes: Secondary | ICD-10-CM

## 2023-05-11 ENCOUNTER — Ambulatory Visit (INDEPENDENT_AMBULATORY_CARE_PROVIDER_SITE_OTHER): Payer: 59 | Admitting: Family Medicine

## 2023-05-11 ENCOUNTER — Encounter: Payer: Self-pay | Admitting: Family Medicine

## 2023-05-11 VITALS — BP 151/84 | HR 92 | Temp 97.9°F | Ht 63.0 in | Wt 317.0 lb

## 2023-05-11 DIAGNOSIS — J069 Acute upper respiratory infection, unspecified: Secondary | ICD-10-CM | POA: Diagnosis not present

## 2023-05-11 DIAGNOSIS — R051 Acute cough: Secondary | ICD-10-CM

## 2023-05-11 MED ORDER — AZITHROMYCIN 250 MG PO TABS: ORAL_TABLET | ORAL | 0 refills | Status: AC

## 2023-05-11 MED ORDER — HYDROCOD POLI-CHLORPHE POLI ER 10-8 MG/5ML PO SUER: 5.0000 mL | Freq: Two times a day (BID) | ORAL | 0 refills | Status: AC | PRN

## 2023-05-11 NOTE — Progress Notes (Signed)
Acute Office Visit  Subjective:     Patient ID: Tara Porter, female    DOB: July 11, 1996, 27 y.o.   MRN: [id number]  Chief Complaint  Patient presents with   Cough     Patient is in today for cough, URI.   Discussed the use of AI scribe software for clinical note transcription with the patient, who gave verbal consent to proceed.  History of Present Illness   The patient, with a known allergy to amoxicillin, presents with a persistent cough of approximately three and a half weeks duration. The cough initially started as a tickle in the throat, accompanied by a runny nose. The patient attempted self-treatment with Mucinex for a week and a half, which seemed to improve symptoms temporarily. However, following her daughter's illness, the patient's symptoms worsened. The cough is described as a mix of productive and dry, with green mucus occasionally expectorated. The patient denies any blood in the cough but notes occasional blood in the mucus, possibly due to nasal irritation.  The patient reports a sensation of heaviness/congestion in the chest, making it difficult to lie flat, necessitating sleep in a recliner. She experienced a single day of fever approximately a week and a half into the illness. The patient denies any ear pain, rashes, nausea, vomiting, but a sensation of pressure in the head (frontal sinuses). The patient also reports a change in voice quality, likely secondary to the cough.  The patient has been using Delsym for symptom management, which was ineffective.            All review of systems negative except what is listed in the HPI      Objective:    BP (!) 151/84   Pulse 92   Temp 97.9 F (36.6 C) (Oral)   Ht 5\' 3"  (1.6 m)   Wt (!) 317 lb (143.8 kg)   SpO2 98%   BMI 56.15 kg/m    Physical Exam Vitals reviewed.  Constitutional:      General: She is not in acute distress.    Appearance: Normal appearance. She is obese.  HENT:     Head:  Normocephalic and atraumatic.     Right Ear: Tympanic membrane normal.     Left Ear: Tympanic membrane normal.     Mouth/Throat:     Pharynx: No posterior oropharyngeal erythema.     Comments: PND/cobblestoning Cardiovascular:     Rate and Rhythm: Normal rate and regular rhythm.     Heart sounds: Normal heart sounds.  Pulmonary:     Effort: Pulmonary effort is normal.     Breath sounds: Normal breath sounds. No wheezing, rhonchi or rales.  Skin:    General: Skin is warm and dry.  Neurological:     Mental Status: She is alert and oriented to person, place, and time.  Psychiatric:        Mood and Affect: Mood normal.        Behavior: Behavior normal.        Thought Content: Thought content normal.        Judgment: Judgment normal.     No results found for any visits on 05/11/23.      Assessment & Plan:   Problem List Items Addressed This Visit   None Visit Diagnoses     Acute cough    -  Primary   Relevant Medications   azithromycin (ZITHROMAX) 250 MG tablet   chlorpheniramine-HYDROcodone (TUSSIONEX) 10-8 MG/5ML   Upper respiratory tract infection,  unspecified type       Relevant Medications   azithromycin (ZITHROMAX) 250 MG tablet   chlorpheniramine-HYDROcodone (TUSSIONEX) 10-8 MG/5ML          Upper Respiratory Infection Persistent cough for 3.5 weeks with intermittent green sputum, nasal congestion, and head/sinus pressure.  -Start Azithromycin (Z-Pak) -Continue Mucinex twice daily -Add Tussionex for nighttime cough  Hypertension Elevated blood pressure at visit, but patient reports it is typically high at doctor's office. -Check blood pressure at home or at local pharmacy 2-3 times per week -Return in 2 weeks for blood pressure recheck in office           Meds ordered this encounter  Medications   azithromycin (ZITHROMAX) 250 MG tablet    Sig: Take 2 tablets on day 1, then 1 tablet daily on days 2 through 5    Dispense:  6 tablet    Refill:  0     Order Specific Question:   Supervising Provider    Answer:   Danise Edge A [[id number]]   chlorpheniramine-HYDROcodone (TUSSIONEX) 10-8 MG/5ML    Sig: Take 5 mLs by mouth every 12 (twelve) hours as needed for up to 5 days.    Dispense:  50 mL    Refill:  0    Order Specific Question:   Supervising Provider    Answer:   Danise Edge A [[id number]]    Return in about 2 weeks (around 05/25/2023) for BP follow-up.  Clayborne Dana, NP

## 2023-05-25 ENCOUNTER — Ambulatory Visit: Payer: 59 | Admitting: Family Medicine

## 2023-05-26 ENCOUNTER — Ambulatory Visit: Payer: 59 | Admitting: Family Medicine

## 2023-06-13 ENCOUNTER — Other Ambulatory Visit (INDEPENDENT_AMBULATORY_CARE_PROVIDER_SITE_OTHER): Payer: 59

## 2023-06-13 ENCOUNTER — Other Ambulatory Visit: Payer: Self-pay

## 2023-06-13 DIAGNOSIS — E282 Polycystic ovarian syndrome: Secondary | ICD-10-CM | POA: Diagnosis not present

## 2023-06-13 LAB — COMPREHENSIVE METABOLIC PANEL
ALT: 22 U/L (ref 0–35)
AST: 18 U/L (ref 0–37)
Albumin: 4.2 g/dL (ref 3.5–5.2)
Alkaline Phosphatase: 68 U/L (ref 39–117)
BUN: 9 mg/dL (ref 6–23)
CO2: 29 meq/L (ref 19–32)
Calcium: 9.1 mg/dL (ref 8.4–10.5)
Chloride: 100 meq/L (ref 96–112)
Creatinine, Ser: 0.67 mg/dL (ref 0.40–1.20)
GFR: 117.98 mL/min (ref >60.00)
Glucose, Bld: 91 mg/dL (ref 70–99)
Potassium: 4 meq/L (ref 3.5–5.1)
Sodium: 137 meq/L (ref 135–145)
Total Bilirubin: 0.6 mg/dL (ref 0.2–1.2)
Total Protein: 7.4 g/dL (ref 6.0–8.3)

## 2023-06-13 LAB — LIPID PANEL
Cholesterol: 168 mg/dL (ref 0–200)
HDL: 34.3 mg/dL — ABNORMAL LOW (ref >39.00)
LDL Cholesterol: 112 mg/dL — ABNORMAL HIGH (ref 0–99)
NonHDL: 134.1
Total CHOL/HDL Ratio: 5
Triglycerides: 110 mg/dL (ref 0.0–149.0)
VLDL: 22 mg/dL (ref 0.0–40.0)

## 2023-06-13 LAB — T4, FREE: Free T4: 0.62 ng/dL (ref 0.60–1.60)

## 2023-06-13 LAB — TSH: TSH: 3.65 u[IU]/mL (ref 0.35–5.50)

## 2023-06-13 LAB — HEMOGLOBIN A1C: Hgb A1c MFr Bld: 5.9 % (ref 4.6–6.5)

## 2023-06-14 LAB — INSULIN, RANDOM: Insulin: 19.6 u[IU]/mL — ABNORMAL HIGH

## 2023-06-15 ENCOUNTER — Encounter: Payer: Self-pay | Admitting: "Endocrinology

## 2023-06-15 ENCOUNTER — Ambulatory Visit (INDEPENDENT_AMBULATORY_CARE_PROVIDER_SITE_OTHER): Payer: 59 | Admitting: "Endocrinology

## 2023-06-15 VITALS — BP 134/94 | HR 136 | Ht 63.0 in | Wt 326.2 lb

## 2023-06-15 DIAGNOSIS — E282 Polycystic ovarian syndrome: Secondary | ICD-10-CM

## 2023-06-15 DIAGNOSIS — E7800 Pure hypercholesterolemia, unspecified: Secondary | ICD-10-CM | POA: Diagnosis not present

## 2023-06-15 DIAGNOSIS — E88819 Insulin resistance, unspecified: Secondary | ICD-10-CM | POA: Diagnosis not present

## 2023-06-15 MED ORDER — METFORMIN HCL ER 500 MG PO TB24: 1000.0000 mg | ORAL_TABLET | Freq: Two times a day (BID) | ORAL | 5 refills | Status: DC

## 2023-06-15 NOTE — Patient Instructions (Signed)
We will start you on an extended release version of metformin, which should help with the upset stomach. Start with one 500 mg capsule taken every evening with dinner. Stay on this for 3-5 days, then increase to 1 tablet with breakfast and one with dinner. If you do not have any upset stomach, gradually increase to the goal dose of 2 capsules with breakfast and 2 capsules with dinner. If you do have upset stomach, decrease it back to the previous dose that you tolerated.  Week 1: one tablet after dinner Week 2: one tablet after breakfast and one after dinner  Week 3: one tablet after breakfast and two after dinner  Week 4 and onwards: two tablets after breakfast and two after dinner

## 2023-06-15 NOTE — Progress Notes (Signed)
Outpatient Endocrinology Note Tara Laclede, MD    Tara Porter [facility] 1995/10/13 [id number]  Referring Provider: Noemi Chapel, NP Primary Care Provider: Clayborne Dana, NP Reason for consultation: Subjective   Assessment & Plan  Diagnoses and all orders for this visit:  PCOS (polycystic ovarian syndrome) -     metFORMIN (GLUCOPHAGE-XR) 500 MG 24 hr tablet; Take 2 tablets (1,000 mg total) by mouth 2 (two) times daily with a meal.  Insulin resistance  Pure hypercholesterolemia  Morbid obesity (HCC) -     metFORMIN (GLUCOPHAGE-XR) 500 MG 24 hr tablet; Take 2 tablets (1,000 mg total) by mouth 2 (two) times daily with a meal.   We will start you on an extended release version of metformin, which should help with the upset stomach. Start with one 500 mg capsule taken every evening with dinner. Stay on this for 3-5 days, then increase to 1 tablet with breakfast and one with dinner. If you do not have any upset stomach, gradually increase to the goal dose of 2 capsules with breakfast and 2 capsules with dinner. If you do have upset stomach, decrease it back to the previous dose that you tolerated.  Week 1: one tablet after dinner Week 2: one tablet after breakfast and one after dinner  Week 3: one tablet after breakfast and two after dinner  Week 4 and onwards: two tablets after breakfast and two after dinner  Pt interested in weight loss Discussed lifestyle changes, medical management as well as bariatric surgery Maintain healthy lifestyle including 1200 Cal/day, 30 min of activity/day, avoiding refined/processed/outside food 20 minutes physical activity per day, in continuum or interruptedly through the day  Goals: less than 60 grams of carbohydrate/meal, 1200-1500 Cal/day, 10,0000 steps a day and weight loss of 0.5-1 lb/ wk  Sleep 7-9 hours/day, adapt good sleep hygiene Adapt de-stressing and relaxation techniques to prevent stress induced weight gain Avoid/switch  medications that lead to weight gain by discussing with the prescribing physician   Not interested in weight loss medications/bariatric surgery at this time Will connect with Ob-gyn for menstrual irregularities   Return in about 3 months (around 09/15/2023).   I have reviewed current medications, nurse's notes, allergies, vital signs, past medical and surgical history, family medical history, and social history for this encounter. Counseled patient on symptoms, examination findings, lab findings, imaging results, treatment decisions and monitoring and prognosis. The patient understood the recommendations and agrees with the treatment plan. All questions regarding treatment plan were fully answered.  Tara Harrington Park, MD  06/15/23   History of Present Illness HPI  Tara Porter is a 27 y.o. year old female who presents for evaluation of PCOS associated weight loss. [facility] was first diagnosed of PCOS at 18.    Current weight Body mass index is 57.78 kg/m., 326 lbs Goal weight <200lbs  Current regimen: Metformin XR 500 gm every day    Patient has   o ovulatory dysfunction   -oligomenorrhea Yes  -prolonged and/or heavy menstrual bleeding  Yes*  o clinical hyperandrogenism, including  -hirsutism  No  -acne No  -andogenetic alopecia No  -virilizing symptoms (rare) including    *female pattern balding No  *increased muscle mass No  *deepening of the voice No  *clitoromegaly No  Physical Exam  BP (!) 134/94   Pulse (!) 136   Ht 5\' 3"  (1.6 m)   Wt (!) 326 lb 3.2 oz (148 kg)   SpO2 97%   BMI 57.78 kg/m  Constitutional: well developed, well nourished Head: normocephalic, atraumatic Eyes: sclera anicteric, no redness Neck: supple Lungs: normal respiratory effort Neurology: alert and oriented Skin: dry, no appreciable rashes Musculoskeletal: no appreciable defects Psychiatric: normal mood and affect   Current Medications Patient's Medications   New Prescriptions   No medications on file  Previous Medications   FERROUS SULFATE (IRON) 325 (65 FE) MG TABS    Take 325 mg by mouth every other day.   LAMOTRIGINE (LAMICTAL XR) 250 MG TB24 24 HOUR TABLET    Take 250 mg by mouth daily at 6 (six) AM.   SOLIFENACIN (VESICARE) 10 MG TABLET    Take 1 tablet (10 mg total) by mouth daily.  Modified Medications   Modified Medication Previous Medication   METFORMIN (GLUCOPHAGE-XR) 500 MG 24 HR TABLET metFORMIN (GLUCOPHAGE-XR) 500 MG 24 hr tablet      Take 2 tablets (1,000 mg total) by mouth 2 (two) times daily with a meal.    TAKE 1 TABLET BY MOUTH EVERY DAY WITH BREAKFAST  Discontinued Medications   No medications on file    Allergies Allergies  Allergen Reactions   Amoxicillin Hives    Has patient had a PCN reaction causing immediate rash, facial/tongue/throat swelling, SOB or lightheadedness with hypotension: no Has patient had a PCN reaction causing severe rash involving mucus membranes or skin necrosis: no Has patient had a PCN reaction that required hospitalizationno Has patient had a PCN reaction occurring within the last 10 years: no If all of the above answers are "NO", then may proceed with Cephalosporin use.      Past Medical History Past Medical History:  Diagnosis Date   Anxiety    Gestational diabetes    PCOS (polycystic ovarian syndrome)    PCOS (polycystic ovarian syndrome)    Prediabetes    UTI (lower urinary tract infection)     Past Surgical History Past Surgical History:  Procedure Laterality Date   CESAREAN SECTION N/A 10/16/2017   Procedure: CESAREAN SECTION;  Surgeon: Maxie Better, MD;  Location: [facility];  Service: Obstetrics;  Laterality: N/A;   TONSILLECTOMY      Family History family history is not on file.  Social History Social History   Socioeconomic History   Marital status: Married    Spouse name: Not on file   Number of children: Not on file   Years of education: Not  on file   Highest education level: GED or equivalent  Occupational History   Not on file  Tobacco Use   Smoking status: Never   Smokeless tobacco: Never  Substance and Sexual Activity   Alcohol use: No   Drug use: No   Sexual activity: Yes    Birth control/protection: None  Other Topics Concern   Not on file  Social History Narrative   Not on file   Social Determinants of Health   Financial Resource Strain: Low Risk  (02/08/2023)   Overall Financial Resource Strain (CARDIA)    Difficulty of Paying Living Expenses: Not very hard  Food Insecurity: No Food Insecurity (02/08/2023)   Hunger Vital Sign    Worried About Running Out of Food in the Last Year: Never true    Ran Out of Food in the Last Year: Never true  Transportation Needs: No Transportation Needs (02/08/2023)   PRAPARE - Administrator, Civil Service (Medical): No    Lack of Transportation (Non-Medical): No  Physical Activity: Insufficiently Active (02/08/2023)   Exercise Vital Sign  Days of Exercise per Week: 3 days    Minutes of Exercise per Session: 30 min  Stress: Stress Concern Present (02/08/2023)   [employer] of Occupational Health - Occupational Stress Questionnaire    Feeling of Stress : Very much  Social Connections: Unknown (02/08/2023)   Social Connection and Isolation Panel [NHANES]    Frequency of Communication with Friends and Family: More than three times a week    Frequency of Social Gatherings with Friends and Family: Once a week    Attends Religious Services: Patient declined    Active Member of Clubs or Organizations: No    Attends Engineer, structural: Not on file    Marital Status: Married  Intimate Partner Violence: Unknown (11/16/2021)   Received from [facility]   HITS    Physically Hurt: Not on file    Insult or Talk Down To: Not on file    Threaten Physical Harm: Not on file    Scream or Curse: Not on file    Lab Results  Component Value  Date   CHOL 168 06/13/2023   Lab Results  Component Value Date   HDL 34.30 (L) 06/13/2023   Lab Results  Component Value Date   LDLCALC 112 (H) 06/13/2023   Lab Results  Component Value Date   TRIG 110.0 06/13/2023   Lab Results  Component Value Date   CHOLHDL 5 06/13/2023   Lab Results  Component Value Date   CREATININE 0.67 06/13/2023   Lab Results  Component Value Date   GFR 117.98 06/13/2023      Component Value Date/Time   NA 137 06/13/2023 1000   K 4.0 06/13/2023 1000   CL 100 06/13/2023 1000   CO2 29 06/13/2023 1000   GLUCOSE 91 06/13/2023 1000   BUN 9 06/13/2023 1000   CREATININE 0.67 06/13/2023 1000   CALCIUM 9.1 06/13/2023 1000   PROT 7.4 06/13/2023 1000   ALBUMIN 4.2 06/13/2023 1000   AST 18 06/13/2023 1000   ALT 22 06/13/2023 1000   ALKPHOS 68 06/13/2023 1000   BILITOT 0.6 06/13/2023 1000   GFRNONAA >60 10/16/2017 1311   GFRAA >60 10/16/2017 1311      Latest Ref Rng & Units 06/13/2023   10:00 AM 02/08/2023   12:26 PM 08/05/2022    2:13 PM  BMP  Glucose 70 - 99 mg/dL 91  84  79   BUN 6 - 23 mg/dL 9  10  9    Creatinine 0.40 - 1.20 mg/dL 6.04  5.40  9.81   Sodium 135 - 145 mEq/L 137  136  138   Potassium 3.5 - 5.1 mEq/L 4.0  4.2  4.0   Chloride 96 - 112 mEq/L 100  101  101   CO2 19 - 32 mEq/L 29  27  28    Calcium 8.4 - 10.5 mg/dL 9.1  9.2  8.8        Component Value Date/Time   WBC 6.2 02/25/2023 0839   RBC 4.98 02/25/2023 0839   HGB 12.5 02/25/2023 0839   HCT 39.2 02/25/2023 0839   PLT 376 02/25/2023 0839   MCV 78.7 (L) 02/25/2023 0839   MCH 25.1 (L) 02/25/2023 0839   MCHC 31.9 (L) 02/25/2023 0839   RDW 14.1 02/25/2023 0839   LYMPHSABS 1,854 02/25/2023 0839   MONOABS 0.3 02/08/2023 1226   EOSABS 143 02/25/2023 0839   BASOSABS 19 02/25/2023 0839   Lab Results  Component Value Date   TSH 3.65  06/13/2023   TSH 2.14 08/05/2022   FREET4 0.62 06/13/2023         Parts of this note may have been dictated using voice recognition  software. There may be variances in spelling and vocabulary which are unintentional. Not all errors are proofread. Please notify the Thereasa Parkin if any discrepancies are noted or if the meaning of any statement is not clear.

## 2023-08-15 ENCOUNTER — Telehealth: Payer: Self-pay

## 2023-08-15 NOTE — Telephone Encounter (Signed)
Initial Comment Caller states schedule appt. States she has really bad cough, full of mucous, congested. Dr. Hyman Hopes Translation No Nurse Assessment Nurse: Evelene Croon, RN, Tara Porter Date/Time Tara Porter Time): 08/13/2023 9:41:06 AM Confirm and document reason for call. If symptomatic, describe symptoms. ---Caller states she has had a really bad cough with mucus and congestion for the last 3 weeks. Does the patient have any new or worsening symptoms? ---Yes Will a triage be completed? ---Yes Related visit to physician within the last 2 weeks? ---N/A Does the PT have any chronic conditions? (i.e. diabetes, asthma, this includes High risk factors for pregnancy, etc.) ---No Is the patient pregnant or possibly pregnant? (Ask all females between the ages of 47-55) ---No Is this a behavioral health or substance abuse call? ---No Guidelines Guideline Title Affirmed Question Affirmed Notes Nurse Date/Time (Eastern Time) Cough - Acute Productive Chest pain (Exception: MILD central chest pain, present only when coughing.) Evelene Croon, RN, Gabriella 08/13/2023 9:42:16 AM Disp. Time Tara Porter Time) Disposition Final User 08/13/2023 9:42:54 AM Go to ED Now Yes Evelene Croon, RN, Tara Porter PLEASE NOTE: All timestamps contained within this report are represented as [place] Standard Time. CONFIDENTIALTY NOTICE: This fax transmission is intended only for the addressee. It contains information that is legally privileged, confidential or otherwise protected from use or disclosure. If you are not the intended recipient, you are strictly prohibited from reviewing, disclosing, copying using or disseminating any of this information or taking any action in reliance on or regarding this information. If you have received this fax in error, please notify us immediately by telephone so that we can arrange for its return to [place]. Phone: [phone], Toll-Free: [phone], Fax: [phone] Page: 2 of 2 Call Id:  [id number] Final Disposition 08/13/2023 9:42:54 AM Go to ED Now Yes Evelene Croon, RN, Wilmon Pali Disagree/Comply Disagree Caller Understands Yes PreDisposition InappropriateToAsk Care Advice Given Per Guideline GO TO ED NOW: * You need to be seen in the Emergency Department. CARE ADVICE given per Cough - Acute Productive (Adult) guideline. Comments User: Tara Ree, RN Date/Time Tara Porter Time): 08/13/2023 9:45:09 AM Caller states she would rather make an appt for Monday. Advised her to contact the office during business hours to schedule appt, verbalized understanding. Referrals GO TO FACILITY REFUSED REFERRED TO PCP OFFICE

## 2023-09-06 DIAGNOSIS — G4719 Other hypersomnia: Secondary | ICD-10-CM

## 2023-09-06 DIAGNOSIS — R0683 Snoring: Secondary | ICD-10-CM

## 2023-09-12 ENCOUNTER — Ambulatory Visit: Payer: 59 | Admitting: "Endocrinology

## 2023-09-13 ENCOUNTER — Ambulatory Visit: Payer: Medicaid Other | Admitting: Family Medicine

## 2023-09-13 NOTE — Telephone Encounter (Signed)
[facility], please see patient's comment regarding scheduling HST and insurance change.  Thank you.

## 2023-09-16 ENCOUNTER — Ambulatory Visit (INDEPENDENT_AMBULATORY_CARE_PROVIDER_SITE_OTHER): Payer: Medicaid Other | Admitting: Family Medicine

## 2023-09-16 ENCOUNTER — Encounter: Payer: Self-pay | Admitting: Family Medicine

## 2023-09-16 VITALS — BP 149/69 | HR 90 | Ht 63.0 in | Wt 326.0 lb

## 2023-09-16 DIAGNOSIS — E282 Polycystic ovarian syndrome: Secondary | ICD-10-CM

## 2023-09-16 DIAGNOSIS — R102 Pelvic and perineal pain: Secondary | ICD-10-CM

## 2023-09-16 DIAGNOSIS — E611 Iron deficiency: Secondary | ICD-10-CM | POA: Insufficient documentation

## 2023-09-16 DIAGNOSIS — N926 Irregular menstruation, unspecified: Secondary | ICD-10-CM | POA: Diagnosis not present

## 2023-09-16 DIAGNOSIS — Z30011 Encounter for initial prescription of contraceptive pills: Secondary | ICD-10-CM

## 2023-09-16 LAB — IBC + FERRITIN
Ferritin: 30.9 ng/mL (ref 10.0–291.0)
Iron: 44 ug/dL (ref 42–145)
Saturation Ratios: 12.8 % — ABNORMAL LOW (ref 20.0–50.0)
TIBC: 343 ug/dL (ref 250.0–450.0)
Transferrin: 245 mg/dL (ref 212.0–360.0)

## 2023-09-16 LAB — TSH: TSH: 2.43 u[IU]/mL (ref 0.35–5.50)

## 2023-09-16 LAB — POCT URINE PREGNANCY: Preg Test, Ur: NEGATIVE

## 2023-09-16 LAB — LUTEINIZING HORMONE: LH: 2.1 m[IU]/mL

## 2023-09-16 MED ORDER — DROSPIRENONE-ETHINYL ESTRADIOL 3-0.02 MG PO TABS
1.0000 | ORAL_TABLET | Freq: Every day | ORAL | 3 refills | Status: DC
Start: 2023-09-16 — End: 2023-11-15

## 2023-09-16 NOTE — Progress Notes (Signed)
Acute Office Visit  Subjective:     Patient ID: Tara Porter, female    DOB: [birth year], 28 y.o.   MRN: [id number]  Chief Complaint  Patient presents with   Medical Management of Chronic Issues    HPI Patient is in today to discuss birth control and PCOS.   Discussed the use of AI scribe software for clinical note transcription with the patient, who gave verbal consent to proceed.  History of Present Illness   The patient, with PCOS, presents with chronic pelvic discomfort and irregular menstrual cycles.  She experiences pelvic pain, described as intermittent and occurring in intervals throughout the day. Some days the pain persists all day, while on other days it comes and goes. The pain is located in the lower pelvic area, affecting both the right and left sides, and she experiences some form of pelvic discomfort daily.  She has not had a menstrual period for approximately one and a half to two months. She is currently experiencing symptoms that suggest an impending period, such as cramping and a small amount of brown discharge last night. The last menstrual period was in November, during which she experienced unusually heavy cycle. She started spotting brown discharge the night before the visit, indicating a possible start of her menstrual cycle. No spotting since her last period until the recent spotting.  She is sexually active but is not using any form of birth control or condoms. She has a history of using birth control pills in the past. She has been married for eight years. She reports occasional painful intercourse and sometimes experiences bleeding after intercourse. No vaginal pain at the time of the visit.  She also has a history of low iron. She has not been consistently taking her iron supplements, admitting to taking them for about two weeks before stopping, and has recently restarted taking them every other day for the past week and a half. Her blood pressure  is elevated today, but usually normal at home.        ROS All review of systems negative except what is listed in the HPI      Objective:    BP (!) 149/69   Pulse 90   Ht 5\' 3"  (1.6 m)   Wt (!) 326 lb (147.9 kg)   SpO2 99%   BMI 57.75 kg/m    Physical Exam Vitals reviewed.  Constitutional:      Appearance: Normal appearance.  Abdominal:     Palpations: Abdomen is soft.     Tenderness: There is no abdominal tenderness. There is no guarding.  Genitourinary:    Comments: Deferred exam Skin:    General: Skin is warm and dry.  Neurological:     Mental Status: She is alert and oriented to person, place, and time.  Psychiatric:        Mood and Affect: Mood normal.        Behavior: Behavior normal.        Thought Content: Thought content normal.        Judgment: Judgment normal.      Results for orders placed or performed in visit on 09/16/23  POCT urine pregnancy  Result Value Ref Range   Preg Test, Ur Negative Negative       Assessment & Plan:   Problem List Items Addressed This Visit       Active Problems   PCOS (polycystic ovarian syndrome) - Primary   Reports of intermittent lower pelvic pain, irregular  menses, and heavy bleeding during last menstrual cycle. Previous CT showed a cyst on the left ovary. -Order ultrasound to assess for changes -Check labs today -Pregnancy test negative today - she would like to go ahead and start birth control. Will send in Yaz -She has an appointment to establish with GYN in March.      Relevant Medications   drospirenone-ethinyl estradiol (YAZ) 3-0.02 MG tablet   Other Relevant Orders   US PELVIC COMPLETE WITH TRANSVAGINAL   Luteinizing hormone   TSH   17-Hydroxyprogesterone   Prolactin   Iron deficiency   Inconsistent use of iron supplements. -Check iron levels and saturation. -If saturation level is below 10, consider iron infusion. -Continue iron supplement every other day and recheck levels in two months.       Relevant Orders   IBC + Ferritin   Other Visit Diagnoses       Missed period     See PCOS plan   Relevant Orders   US PELVIC COMPLETE WITH TRANSVAGINAL   Luteinizing hormone   TSH   17-Hydroxyprogesterone   Prolactin   POCT urine pregnancy (Completed)     Pelvic pain     No alarm findings today. Exam deferred. Keep upcoming GYN appointment. ED for severe symptoms.    Relevant Orders   US PELVIC COMPLETE WITH TRANSVAGINAL     Encounter for initial prescription of contraceptive pills       Relevant Medications   drospirenone-ethinyl estradiol (YAZ) 3-0.02 MG tablet           Meds ordered this encounter  Medications   drospirenone-ethinyl estradiol (YAZ) 3-0.02 MG tablet    Sig: Take 1 tablet by mouth daily.    Dispense:  84 tablet    Refill:  3    Supervising Provider:   Danise Edge A [[id number]]     Return if symptoms worsen or fail to improve.  Clayborne Dana, NP

## 2023-09-16 NOTE — Assessment & Plan Note (Signed)
Inconsistent use of iron supplements. -Check iron levels and saturation. -If saturation level is below 10, consider iron infusion. -Continue iron supplement every other day and recheck levels in two months.

## 2023-09-16 NOTE — Assessment & Plan Note (Signed)
Reports of intermittent lower pelvic pain, irregular menses, and heavy bleeding during last menstrual cycle. Previous CT showed a cyst on the left ovary. -Order ultrasound to assess for changes -Check labs today -Pregnancy test negative today - she would like to go ahead and start birth control. Will send in Yaz -She has an appointment to establish with GYN in March.

## 2023-09-17 LAB — PROLACTIN: Prolactin: 5.7 ng/mL

## 2023-09-18 ENCOUNTER — Encounter: Payer: Self-pay | Admitting: Family Medicine

## 2023-09-22 LAB — 17-HYDROXYPROGESTERONE: 17-OH-Progesterone, LC/MS/MS: <8 ng/dL

## 2023-09-23 ENCOUNTER — Ambulatory Visit: Payer: Self-pay | Admitting: Family Medicine

## 2023-09-23 NOTE — Telephone Encounter (Signed)
 Chief Complaint: Medication question Symptoms: n/a Frequency: n/a Pertinent Negatives: Patient denies n/a Disposition: [] ED /[] Urgent Care (no appt availability in office) / [] Appointment(In office/virtual)/ []  [facility]/ [x] Home Care/ [] Refused Recommended Disposition /[] [facility]/ []  Follow-up with PCP Additional Notes: Patient states hospital prescribed her Abx and Hydroxyzine recently and she wants to ensure there are no medication reactions between those two medications and her iron supplement, metformin  and vesicare . Advised patient to ensure she does consult with pharmacist when she picks up medication to ask about medication reactions. Advised patient to ensure she utilizes extra precautions while on birth control and abx. Advised patient to call back if any symptoms develop or she has any further concerns.    Copied from CRM [id number]. Topic: Clinical - Medication Question >> Sep 23, 2023  3:44 PM Corean SAUNDERS wrote: Reason for CRM: Patient is requesting to speak with a nurse as she was prescribed hydroxyzine and an antibiotic and is worried this will interact with her current medications and birth control. Please call [phone] to advise. Reason for Disposition  General information question, no triage required and triager able to answer question  Answer Assessment - Initial Assessment Questions 1. REASON FOR CALL or QUESTION: What is your reason for calling today? or How can I best help you? or What question do you have that I can help answer?     Patient states hospital prescribed her Abx and Hydroxyzine recently and she wants to ensure there are no medication reactions between those two medications and her iron supplement, metformin  and vesicare .  Protocols used: Information Only Call - No Triage-A-AH

## 2023-10-21 ENCOUNTER — Ambulatory Visit: Payer: 59 | Admitting: "Endocrinology

## 2023-10-24 ENCOUNTER — Encounter: Payer: Medicaid Other | Admitting: Obstetrics and Gynecology

## 2023-10-28 ENCOUNTER — Ambulatory Visit (INDEPENDENT_AMBULATORY_CARE_PROVIDER_SITE_OTHER): Admitting: Family Medicine

## 2023-10-28 ENCOUNTER — Encounter: Payer: Self-pay | Admitting: Family Medicine

## 2023-10-28 VITALS — BP 108/67 | HR 81 | Temp 97.8°F | Ht 63.0 in | Wt 321.0 lb

## 2023-10-28 DIAGNOSIS — J0140 Acute pansinusitis, unspecified: Secondary | ICD-10-CM | POA: Diagnosis not present

## 2023-10-28 MED ORDER — AZITHROMYCIN 250 MG PO TABS: ORAL_TABLET | ORAL | 0 refills | Status: AC

## 2023-10-28 MED ORDER — BENZONATATE 200 MG PO CAPS: 200.0000 mg | ORAL_CAPSULE | Freq: Two times a day (BID) | ORAL | 0 refills | Status: DC | PRN

## 2023-10-28 NOTE — Progress Notes (Signed)
 Acute Office Visit  Subjective:     Patient ID: Tara Porter, female    DOB: 1996-07-19, 28 y.o.   MRN: [id number]  Chief Complaint  Patient presents with   Cough     Patient is in today for cough, sinus pain.   Discussed the use of AI scribe software for clinical note transcription with the patient, who gave verbal consent to proceed.  History of Present Illness Tara Porter is a 28 year old female who presents with sinus pressure and cough for over a week.  She has been experiencing sinus pressure and cough for over a week, beginning with significant pressure in her head and face, followed by drainage and a sore throat. The symptoms alternate between pressure and drainage, accompanied by a cough. No fever is present, unlike her daughter who had fever and diarrhea. Her symptoms are primarily upper respiratory, including sinus pressure and cough.  The cough occurs in fits, especially when her throat is dry or she is hot, and sometimes she is able to cough up phlegm. At night, she experiences pressure when lying down, which she alleviates by elevating her head. She describes her sinus pressure as a 5 out of 10 in severity.  She has been using over-the-counter medications including Mucinex and Claritin to manage her symptoms. She reports a past allergic reaction to amoxicillin, which causes hives, and a recent use of clindamycin for dental fillings, which resulted in heartburn. She has not had a Z-Pak since last year, which was effective at that time.  Her daughter recently had a similar illness and was tested negative for COVID, RSV, strep, and flu.  No fever. Occasional pressure in her ears but no pain. She experiences a mix of nasal congestion and runniness. No wheezing or trouble breathing, except for pressure at night when lying down. She had a sore throat on the second or third day, likely due to drainage.         All review of systems negative except  what is listed in the HPI      Objective:    BP 108/67   Pulse 81   Temp 97.8 F (36.6 C) (Oral)   Ht 5\' 3"  (1.6 m)   Wt (!) 321 lb (145.6 kg)   SpO2 98%   BMI 56.86 kg/m    Physical Exam Vitals reviewed.  Constitutional:      Appearance: Normal appearance.  HENT:     Head: Normocephalic and atraumatic.     Right Ear: Tympanic membrane normal.     Left Ear: Tympanic membrane normal.     Nose: Congestion present.     Mouth/Throat:     Mouth: Mucous membranes are moist.     Comments: Mild cobblestoning/PND Cardiovascular:     Rate and Rhythm: Normal rate and regular rhythm.  Pulmonary:     Effort: Pulmonary effort is normal.     Breath sounds: Normal breath sounds.  Skin:    General: Skin is warm and dry.  Neurological:     Mental Status: She is alert.  Psychiatric:        Mood and Affect: Mood normal.        Behavior: Behavior normal.        Thought Content: Thought content normal.        Judgment: Judgment normal.         No results found for any visits on 10/28/23.      Assessment & Plan:  Problem List Items Addressed This Visit   None Visit Diagnoses       Acute non-recurrent pansinusitis    -  Primary   Relevant Medications   azithromycin (ZITHROMAX) 250 MG tablet   benzonatate (TESSALON) 200 MG capsule      Assessment & Plan Acute Sinusitis Symptoms consistent with acute sinusitis. Cough attributed to post-nasal drainage. - Prescribe azithromycin (Z-Pak). - Continue guaifenesin (Mucinex) and loratadine (Claritin). - Use saline nasal sprays throughout the day. - Administer fluticasone (Flonase) once daily. - Prescribe benzonatate (Tessalon Perles) for cough. - Advise rest and hydration. - Instruct to report symptom improvement by Monday. - If no improvement by Monday let me know      Meds ordered this encounter  Medications   azithromycin (ZITHROMAX) 250 MG tablet    Sig: Take 2 tablets on day 1, then 1 tablet daily on days 2  through 5    Dispense:  6 tablet    Refill:  0    Supervising Provider:   Danise Edge A [[id number]]   benzonatate (TESSALON) 200 MG capsule    Sig: Take 1 capsule (200 mg total) by mouth 2 (two) times daily as needed for cough.    Dispense:  20 capsule    Refill:  0    Supervising Provider:   Danise Edge A [[id number]]    Return if symptoms worsen or fail to improve.  Clayborne Dana, NP

## 2023-11-03 ENCOUNTER — Ambulatory Visit (HOSPITAL_BASED_OUTPATIENT_CLINIC_OR_DEPARTMENT_OTHER): Payer: Medicaid Other | Attending: Nurse Practitioner | Admitting: Pulmonary Disease

## 2023-11-03 DIAGNOSIS — R0683 Snoring: Secondary | ICD-10-CM | POA: Diagnosis not present

## 2023-11-03 DIAGNOSIS — R400 Somnolence: Secondary | ICD-10-CM | POA: Diagnosis present

## 2023-11-03 DIAGNOSIS — G4719 Other hypersomnia: Secondary | ICD-10-CM

## 2023-11-10 DIAGNOSIS — G4719 Other hypersomnia: Secondary | ICD-10-CM

## 2023-11-10 NOTE — Procedures (Signed)
 Patient Name:  Tara Porter, Tarter Study Date:  11/03/2023 Referring Physician:  Micheline Maze MD  Indications for Polysomnography The patient is a 28 year-old Female who is 5\' 3"  and weighs 321.0 lbs. Her BMI equals 56.9.  A full night polysomnogram was performed to evaluate for -.  Polysomnogram Data A full night polysomnogram recorded the standard physiologic parameters including EEG, EOG, EMG, EKG, nasal and oral airflow.  Respiratory parameters of chest and abdominal movements were recorded with Respiratory Inductance Plethysmography belts.  Oxygen saturation was recorded by pulse oximetry.   Sleep Architecture The total recording time of the polysomnogram was 391.9 minutes.  The total sleep time was 354.5 minutes.  The patient spent 1.1% of total sleep time in Stage N1, 46.1% in Stage N2, 21.6% in Stages N3, and 31.2% in REM.  Sleep latency was 6.5 minutes.  REM latency was 100.0 minutes.  Sleep Efficiency was 90.4%.  Wake after Sleep Onset time was 31.0 minutes.  Respiratory Events The polysomnogram revealed a presence of - obstructive, - central, and - mixed apneas resulting in an Apnea index of - events per hour.  There were 45 hypopneas (>=3% desaturation and/or arousal) resulting in an Apnea\Hypopnea Index (AHI >=3% desaturation and/or arousal) of 7.6 events per hour.  There were 23 hypopneas (>=4% desaturation) resulting in an Apnea\Hypopnea Index (AHI >=4% desaturation) of 3.9 events per hour.  There were - Respiratory Effort Related Arousals resulting in a RERA index of - events per hour. The Respiratory Disturbance Index is 7.6 events per hour.  The snore index was 12.9 events per hour. Mean oxygen saturation was 94.9%.  The lowest oxygen saturation during sleep was 88.0%.  Time spent <=88% oxygen saturation was 0.1 minutes (-).  Limb Activity There were - total limb movements recorded, of this total, - were classified as PLMs.  PLM index was - per hour and PLM associated with Arousals  index was - per hour.  Cardiac Summary The average pulse rate was 80.5 bpm.  The minimum pulse rate was 59.0 bpm while the maximum pulse rate was 106.0 bpm.  Cardiac rhythm was normal/abnormal.  Diagnosis: Very mild OSA by 3% criteria, does not meet significance by 4% criteria  Recommendations:  Options or treatment for mild obstructive sleep apnea include oral appliance or CPAP therapy. The cardiovascular implication of mild OSA are minimal. Watchful waiting with focus on weight loss and sleep position modification mas also be appropriate is not symptomatic. Positional therapy should be advised. Weight loss encouraged or maintain ideal body weight. Patient should be cautioned against driving when sleepy and should avoid medication with sedative side effects.    This study was personally reviewed and electronically signed by: Cyril Mourning MD Accredited Board Certified in Sleep Medicine

## 2023-11-11 ENCOUNTER — Encounter: Payer: Self-pay | Admitting: Family Medicine

## 2023-11-14 NOTE — Progress Notes (Signed)
 Mild OSA. Please schedule f/u to discuss

## 2023-11-15 ENCOUNTER — Other Ambulatory Visit (HOSPITAL_COMMUNITY)
Admission: RE | Admit: 2023-11-15 | Discharge: 2023-11-15 | Disposition: A | Discharge status: Home / Self Care | Source: Ambulatory Visit | Attending: Obstetrics and Gynecology | Admitting: Obstetrics and Gynecology

## 2023-11-15 ENCOUNTER — Ambulatory Visit: Admitting: Advanced Practice Midwife

## 2023-11-15 ENCOUNTER — Encounter: Payer: Self-pay | Admitting: Advanced Practice Midwife

## 2023-11-15 VITALS — BP 136/82 | HR 76 | Ht 63.0 in | Wt 320.0 lb

## 2023-11-15 DIAGNOSIS — Z1151 Encounter for screening for human papillomavirus (HPV): Secondary | ICD-10-CM

## 2023-11-15 DIAGNOSIS — Z01419 Encounter for gynecological examination (general) (routine) without abnormal findings: Secondary | ICD-10-CM | POA: Diagnosis not present

## 2023-11-15 DIAGNOSIS — N3946 Mixed incontinence: Secondary | ICD-10-CM

## 2023-11-15 DIAGNOSIS — N912 Amenorrhea, unspecified: Secondary | ICD-10-CM

## 2023-11-15 DIAGNOSIS — Z124 Encounter for screening for malignant neoplasm of cervix: Secondary | ICD-10-CM | POA: Diagnosis present

## 2023-11-15 DIAGNOSIS — Z113 Encounter for screening for infections with a predominantly sexual mode of transmission: Secondary | ICD-10-CM | POA: Insufficient documentation

## 2023-11-15 DIAGNOSIS — Z3009 Encounter for other general counseling and advice on contraception: Secondary | ICD-10-CM

## 2023-11-15 DIAGNOSIS — E282 Polycystic ovarian syndrome: Secondary | ICD-10-CM | POA: Diagnosis not present

## 2023-11-15 DIAGNOSIS — Z30011 Encounter for initial prescription of contraceptive pills: Secondary | ICD-10-CM

## 2023-11-15 MED ORDER — DROSPIRENONE-ETHINYL ESTRADIOL 3-0.02 MG PO TABS: 1.0000 | ORAL_TABLET | Freq: Every day | ORAL | 12 refills | Status: DC

## 2023-11-15 MED ORDER — SOLIFENACIN SUCCINATE 10 MG PO TABS: 10.0000 mg | ORAL_TABLET | Freq: Every day | ORAL | 12 refills | Status: DC

## 2023-11-15 NOTE — Progress Notes (Unsigned)
 GYNECOLOGY ANNUAL PREVENTATIVE CARE ENCOUNTER NOTE  History:     Tara Porter is a 28 y.o. [phone] female here for a routine annual gynecologic exam.  Current complaints: chronic irregular menstrual cycles, mixed urinary incontinence. Was prescribed Vesicare but it did not help. Recently prescribed OCPS but did not start them yet. Wants to discussed R/B. Last IC > 2 weeks ago. Does not think she wants to have any more pregnancies. Denies abnormal vaginal bleeding, discharge, pelvic pain, problems with intercourse or other gynecologic concerns.    Gynecologic History No LMP recorded. (Menstrual status: Irregular Periods). Around three months ago.  Contraception: none Last Pap: around 2021. Results were: normal per pt Last mammogram: NA. Results were: NA   Upstream - 11/20/23 1704       Pregnancy Intention Screening   Does the patient want to become pregnant in the next year? No    Does the patient's partner want to become pregnant in the next year? No    Would the patient like to discuss contraceptive options today? Yes      Contraception Wrap Up   Current Method No Contraceptive Precautions    End Method Oral Contraceptive    Contraception Counseling Provided Yes    How was the end contraceptive method provided? Prescription            The pregnancy intention screening data noted above was reviewed. Potential methods of contraception were discussed. The patient elected to proceed with Oral Contraceptive.  Obstetric History OB History  Gravida Para Term Preterm AB Living  3 1  1 2 1   SAB IAB Ectopic Multiple Live Births  2   0 1    # Outcome Date GA Lbr Len/2nd Weight Sex Type Anes PTL Lv  3 Preterm 10/16/17 [redacted]w[redacted]d  2 lb 7.5 oz (1.12 kg) F CS-LVertical Spinal  LIV  2 SAB           1 SAB             Past Medical History:  Diagnosis Date   Anxiety    Gestational diabetes    PCOS (polycystic ovarian syndrome)    PCOS (polycystic ovarian syndrome)     Prediabetes    UTI (lower urinary tract infection)     Past Surgical History:  Procedure Laterality Date   CESAREAN SECTION N/A 10/16/2017   Procedure: CESAREAN SECTION;  Surgeon: Maxie Better, MD;  Location: [facility];  Service: Obstetrics;  Laterality: N/A;   TONSILLECTOMY      Current Outpatient Medications on File Prior to Visit  Medication Sig Dispense Refill   Ferrous Sulfate (IRON) 325 (65 Fe) MG TABS Take 325 mg by mouth every other day.     metFORMIN (GLUCOPHAGE-XR) 500 MG 24 hr tablet Take 2 tablets (1,000 mg total) by mouth 2 (two) times daily with a meal. 120 tablet 5   solifenacin (VESICARE) 10 MG tablet Take 1 tablet (10 mg total) by mouth daily. 90 tablet 3   benzonatate (TESSALON) 200 MG capsule Take 1 capsule (200 mg total) by mouth 2 (two) times daily as needed for cough. (Patient not taking: Reported on 11/15/2023) 20 capsule 0   drospirenone-ethinyl estradiol (YAZ) 3-0.02 MG tablet Take 1 tablet by mouth daily. (Patient not taking: Reported on 11/15/2023) 84 tablet 3   No current facility-administered medications on file prior to visit.    Allergies  Allergen Reactions   Amoxicillin Hives    Has patient had a PCN reaction  causing immediate rash, facial/tongue/throat swelling, SOB or lightheadedness with hypotension: no Has patient had a PCN reaction causing severe rash involving mucus membranes or skin necrosis: no Has patient had a PCN reaction that required hospitalizationno Has patient had a PCN reaction occurring within the last 10 years: no If all of the above answers are "NO", then may proceed with Cephalosporin use.      Social History:  reports that she has never smoked. She has never used smokeless tobacco. She reports that she does not drink alcohol and does not use drugs.  Family History  Problem Relation Age of Onset   Heart failure Father    Diabetes Mother    Raynaud syndrome Mother     The following portions of the patient's  history were reviewed and updated as appropriate: allergies, current medications, past family history, past medical history, past social history, past surgical history and problem list.  Review of Systems Review of Systems  Constitutional:  Negative for chills and fever.  Gastrointestinal:  Negative for abdominal pain, nausea and vomiting.  Genitourinary:  Positive for menstrual problem and urgency. Negative for difficulty urinating, dyspareunia, dysuria, frequency, genital sores, hematuria, pelvic pain, vaginal bleeding and vaginal discharge.       Leaking urine w/ stress  Skin:        Hydradenitis suppurativa      Physical Exam:  BP 136/82 (BP Location: Left Arm, Patient Position: Sitting, Cuff Size: Large)   Pulse 76   Ht 5\' 3"  (1.6 m)   Wt (!) 320 lb (145.2 kg)   BMI 56.69 kg/m  CONSTITUTIONAL: Well-developed, well-nourished female in no acute distress.  HENT:  Normocephalic, atraumatic. Oropharynx is clear and moist EYES: Conjunctivae normal. No scleral icterus.  SKIN: Skin is warm and dry. Healed hydradenitis lesions noted in groin, axillae and breasts. Not diaphoretic. No erythema. No pallor. MUSCULOSKELETAL: Normal range of motion. No tenderness.  No cyanosis or edema.   NEUROLOGIC: Alert and oriented to person, place, and time. Normal muscle tone coordination.  PSYCHIATRIC: Normal mood and affect. Normal behavior. Normal judgment and thought content. CARDIOVASCULAR: Normal heart rate noted. RESPIRATORY: Effort and rate normal. BREASTS: No masses, dimpling of skin, lymphadenopathy, retraction of or drainage from nipples. ABDOMEN: Soft, no distention, tenderness, rebound or guarding.  PELVIC: Normal appearing external genitalia; normal appearing vaginal mucosa and cervix.  No abnormal discharge noted.  Pap smear obtained. Exam limited by body habitus.   Assessment and Plan:   1. PCOS (polycystic ovarian syndrome) (Primary) - drospirenone-ethinyl estradiol (YAZ) 3-0.02 MG  tablet; Take 1 tablet by mouth daily.  Dispense: 28 tablet; Refill: 12 - Encouraged use of OCPs to regulate menstrual cycles. Discussed that long-term oligomenorrhea increases risk of endometrial hyperplasia and cancer.   2. Screen for STD (sexually transmitted disease) - Cytology - PAP( [place]) - RPR+HBsAg+HCVAb+...  3. Amenorrhea - POCT urine pregnancy - drospirenone-ethinyl estradiol (YAZ) 3-0.02 MG tablet; Take 1 tablet by mouth daily.  Dispense: 28 tablet; Refill: 12  4. Mixed stress and urge incontinence - Ambulatory referral to Physical Therapy - solifenacin (VESICARE) 10 MG tablet; Take 1 tablet (10 mg total) by mouth daily.  Dispense: 30 tablet; Refill: 12  5. Cervical cancer screening - Cytology - PAP( [place])  6. Counseling for birth control, oral contraceptives  7. Encounter for initial prescription of contraceptive pills - drospirenone-ethinyl estradiol (YAZ) 3-0.02 MG tablet; Take 1 tablet by mouth daily.  Dispense: 28 tablet; Refill: 12 - OCPs are CDC [facility] category  2. - Discussed that although BMI > 30 increases risk of thrombosis, she is overall at low risk. She would also benefit from regular menstrual cycles.   Will follow up results of pap smear and manage accordingly. Mammogram not indicated.  Routine preventative health maintenance measures emphasized. Please refer to After Visit Summary for other counseling recommendations.      Dorathy Kinsman, CNM [facility] [employer], [facility]

## 2023-11-16 LAB — RPR+HBSAG+HCVAB+...
HIV Screen 4th Generation wRfx: NONREACTIVE
Hep C Virus Ab: NONREACTIVE
Hepatitis B Surface Ag: NEGATIVE
RPR Ser Ql: NONREACTIVE

## 2023-11-17 LAB — CYTOLOGY - PAP
Chlamydia: NEGATIVE
Comment: NEGATIVE
Comment: NEGATIVE
Comment: NORMAL
Diagnosis: NEGATIVE
High risk HPV: NEGATIVE
Neisseria Gonorrhea: NEGATIVE

## 2023-11-20 ENCOUNTER — Encounter: Payer: Self-pay | Admitting: Advanced Practice Midwife

## 2023-11-24 ENCOUNTER — Encounter: Payer: Self-pay | Admitting: Advanced Practice Midwife

## 2023-11-26 ENCOUNTER — Ambulatory Visit (HOSPITAL_BASED_OUTPATIENT_CLINIC_OR_DEPARTMENT_OTHER)
Admission: RE | Admit: 2023-11-26 | Discharge: 2023-11-26 | Disposition: A | Discharge status: Home / Self Care | Source: Ambulatory Visit | Attending: Family Medicine | Admitting: Family Medicine

## 2023-11-26 DIAGNOSIS — R102 Pelvic and perineal pain: Secondary | ICD-10-CM | POA: Diagnosis present

## 2023-11-26 DIAGNOSIS — E282 Polycystic ovarian syndrome: Secondary | ICD-10-CM | POA: Insufficient documentation

## 2023-11-26 DIAGNOSIS — N926 Irregular menstruation, unspecified: Secondary | ICD-10-CM | POA: Insufficient documentation

## 2023-11-30 ENCOUNTER — Encounter: Payer: Self-pay | Admitting: Family Medicine

## 2023-12-30 ENCOUNTER — Telehealth: Admitting: Nurse Practitioner

## 2023-12-30 ENCOUNTER — Encounter: Payer: Self-pay | Admitting: Nurse Practitioner

## 2023-12-30 DIAGNOSIS — G4719 Other hypersomnia: Secondary | ICD-10-CM

## 2023-12-30 DIAGNOSIS — R0683 Snoring: Secondary | ICD-10-CM | POA: Diagnosis not present

## 2023-12-30 DIAGNOSIS — R351 Nocturia: Secondary | ICD-10-CM

## 2023-12-30 NOTE — Assessment & Plan Note (Signed)
 Very mild OSA by 3%. She does not meet diagnostic criteria for OSA based on 4% scoring. Unfortunately do not think we would be able to get CPAP covered as majority of insurance companies relay on 4% scoring. We discussed this today. Minimal cardiovascular risks associated with mild OSA. Reviewed options including positional sleeping, weight loss or oral appliance. She is interested in oral appliance - will place referral to orthodontics for eval/pricing. Encouraged to work on healthy [employer]. If symptoms fail to improve or worsen, could consider MSLT but she does not have typical narcoleptic symptoms. Safe driving practices reviewed.   Patient Instructions  Referral to medical weight management  Referral for oral appliance  Follow up in 6 months with Tara Celena Lanius,NP or sooner, if needed

## 2023-12-30 NOTE — Assessment & Plan Note (Signed)
 Unclear if this is related given results. Follow up with PCP

## 2023-12-30 NOTE — Assessment & Plan Note (Signed)
 Referral to medical weight management. Healthy weight loss encouraged.

## 2023-12-30 NOTE — Progress Notes (Signed)
 Patient ID: Tara Porter, female     DOB: 09/26/1995, 28 y.o.      MRN: [id number]  No chief complaint on file.   Virtual Visit via Video Note  I connected with Tara Porter on 12/30/23 at  3:30 PM EDT by a video enabled telemedicine application and verified that I am speaking with the correct person using two identifiers.  Location: Patient: Home Provider: Office   I discussed the limitations of evaluation and management by telemedicine and the availability of in person appointments. The patient expressed understanding and agreed to proceed.  HPI: 28 year old female, never smoker referred for sleep consult.  Past medical history significant for PCOS, chronic anemia, anxiety, prediabetes, severe obesity.   TEST/EVENTS:  11/03/2023 PSG: AHI (3%) 7.6/h, SpO2 low 88%  12/22/2022: OV with Makell Drohan NP for consult, referred by Tara Amass, NP.  She had been discussing her issues with nocturia and was recommended to have evaluation for sleep apnea.  She tells me that she has had trouble with her sleep for a long time but has gotten worse since she gained 100 pounds over the last year.  She has restless sleep at night and wakes up frequently throughout the night.  Has to urinate often.  Wakes feeling tired in the morning.  She has daytime fatigue.  Her husband has told her that she snores at night.  Has never said that she stops breathing when she sleeps.  She does have morning headaches and dry mouth.  Occasionally has some sleep talking.  Denies any sleepwalking, sleep paralysis, drowsy driving.  No history of narcolepsy or symptoms of cataplexy.  She has been struggling with weight gain and has trouble losing it especially with her PCOS.  She was recently diagnosed with prediabetes.  She has never had endocrinology evaluation.  She is currently on metformin  and birth control to try to better manage her PCOS but she is not sure if it is helping.  Would like to know if she can do  anything else or see a specialist. She goes to bed between 10 PM and 1 AM.  Falls asleep within 30 minutes.  Wakes 3-5 times at night.  She gets up around 6 to 8 AM.  Does not operate any heavy machinery other than a car.  No previous sleep study. No history of cardiac disease or stroke.  She is never smoker.  Does not drink any alcohol.  No excessive caffeine intake.  Lives with her husband and daughter.  She is a stay-at-home mother.  Family history of diabetes. [place] 6  12/30/2023: Today - follow up Patient presents today for follow-up. She had her sleep study in March. Her 3% AHI showed very mild OSA while 4% scoring was not diagnostic for sleep apnea.  She continues to have daytime fatigue and some snoring at night.  Feels relatively unchanged compared to her last visit.  Her biggest concern was nocturia as she tends to wake up multiple times a night to use the restroom. She does have prediabetes and is on metforim. No significant weight change since her last visit.   Allergies  Allergen Reactions   Amoxicillin Hives    Has patient had a PCN reaction causing immediate rash, facial/tongue/throat swelling, SOB or lightheadedness with hypotension: no Has patient had a PCN reaction causing severe rash involving mucus membranes or skin necrosis: no Has patient had a PCN reaction that required hospitalizationno Has patient had a PCN reaction occurring within the last 10  years: no If all of the above answers are "NO", then may proceed with Cephalosporin use.     Immunization History  Administered Date(s) Administered   Tdap 10/17/2017   Past Medical History:  Diagnosis Date   Anxiety    Gestational diabetes    PCOS (polycystic ovarian syndrome)    PCOS (polycystic ovarian syndrome)    Prediabetes    UTI (lower urinary tract infection)     Tobacco History: Social History   Tobacco Use  Smoking Status Never  Smokeless Tobacco Never   Counseling given: Not Answered   Outpatient  Medications Prior to Visit  Medication Sig Dispense Refill   benzonatate  (TESSALON ) 200 MG capsule Take 1 capsule (200 mg total) by mouth 2 (two) times daily as needed for cough. (Patient not taking: Reported on 11/15/2023) 20 capsule 0   drospirenone -ethinyl estradiol  (YAZ) 3-0.02 MG tablet Take 1 tablet by mouth daily. 28 tablet 12   Ferrous Sulfate  (IRON) 325 (65 Fe) MG TABS Take 325 mg by mouth every other day.     metFORMIN  (GLUCOPHAGE -XR) 500 MG 24 hr tablet Take 2 tablets (1,000 mg total) by mouth 2 (two) times daily with a meal. 120 tablet 5   solifenacin  (VESICARE ) 10 MG tablet Take 1 tablet (10 mg total) by mouth daily. 30 tablet 12   No facility-administered medications prior to visit.     Review of Systems:   Constitutional: No weight change, night sweats, fevers, chills, or lassitude. +excessive daytime fatigue HEENT: No difficulty swallowing, tooth/dental problems, or sore throat. No sneezing, itching, ear ache, nasal congestion, or post nasal drip. +AM headaches and dry mouth CV:  No chest pain, orthopnea, PND, swelling in lower extremities, anasarca, dizziness, palpitations, syncope Resp: +snoring. No shortness of breath with exertion or at rest. No cough. GI:  +occasional heartburn, indigestion. No abdominal pain, nausea, vomiting, diarrhea, change in bowel habits, loss of appetite, bloody stools.  GU: No dysuria, change in color of urine, urgency. +nocturia Skin: No rash, lesions, ulcerations MSK:  No joint pain or swelling.   Neuro: No dizziness or lightheadedness.  Psych: +depression, anxiety (stable). No SI/HI. Mood stable. +sleep disturbance  Observations/Objective: Patient is well-developed, well-nourished in no acute distress.  Resting comfortably at home.  No labored breathing.  Speech is clear and coherent with logical content.  Patient is alert and oriented at baseline.   Assessment and Plan: Excessive daytime sleepiness Very mild OSA by 3%. She does not  meet diagnostic criteria for OSA based on 4% scoring. Unfortunately do not think we would be able to get CPAP covered as majority of insurance companies relay on 4% scoring. We discussed this today. Minimal cardiovascular risks associated with mild OSA. Reviewed options including positional sleeping, weight loss or oral appliance. She is interested in oral appliance - will place referral to orthodontics for eval/pricing. Encouraged to work on healthy [employer]. If symptoms fail to improve or worsen, could consider MSLT but she does not have typical narcoleptic symptoms. Safe driving practices reviewed.   Patient Instructions  Referral to medical weight management  Referral for oral appliance  Follow up in 6 months with Katie Katyra Tomassetti,NP or sooner, if needed    Obesity, morbid, BMI 50 or higher (HCC) Referral to medical weight management. Healthy weight loss encouraged.   Snoring See above  Nocturia Unclear if this is related given results. Follow up with PCP     I discussed the assessment and treatment plan with the patient. The patient was provided  an opportunity to ask questions and all were answered. The patient agreed with the plan and demonstrated an understanding of the instructions.   The patient was advised to call back or seek an in-person evaluation if the symptoms worsen or if the condition fails to improve as anticipated.  I provided 31 minutes of non-face-to-face time during this encounter.   Roetta Clarke, NP

## 2023-12-30 NOTE — Assessment & Plan Note (Signed)
 See above

## 2023-12-30 NOTE — Patient Instructions (Signed)
 Referral to medical weight management  Referral for oral appliance  Follow up in 6 months with Katie Khristi Schiller,NP or sooner, if needed

## 2024-01-02 ENCOUNTER — Encounter (INDEPENDENT_AMBULATORY_CARE_PROVIDER_SITE_OTHER): Payer: Self-pay

## 2024-01-24 ENCOUNTER — Encounter: Payer: Self-pay | Admitting: Family Medicine

## 2024-01-30 ENCOUNTER — Ambulatory Visit: Payer: Self-pay | Admitting: Family Medicine

## 2024-01-30 ENCOUNTER — Ambulatory Visit (HOSPITAL_BASED_OUTPATIENT_CLINIC_OR_DEPARTMENT_OTHER)
Admission: RE | Admit: 2024-01-30 | Discharge: 2024-01-30 | Disposition: A | Discharge status: Home / Self Care | Source: Ambulatory Visit | Attending: Family Medicine | Admitting: Family Medicine

## 2024-01-30 ENCOUNTER — Ambulatory Visit (INDEPENDENT_AMBULATORY_CARE_PROVIDER_SITE_OTHER): Admitting: Family Medicine

## 2024-01-30 VITALS — BP 135/74 | HR 87 | Temp 98.4°F | Ht 63.0 in | Wt 322.0 lb

## 2024-01-30 DIAGNOSIS — R051 Acute cough: Secondary | ICD-10-CM | POA: Insufficient documentation

## 2024-01-30 DIAGNOSIS — R12 Heartburn: Secondary | ICD-10-CM

## 2024-01-30 MED ORDER — OMEPRAZOLE 40 MG PO CPDR: 40.0000 mg | DELAYED_RELEASE_CAPSULE | Freq: Every day | ORAL | 3 refills | Status: DC

## 2024-01-30 MED ORDER — GUAIFENESIN ER 600 MG PO TB12: 1200.0000 mg | ORAL_TABLET | Freq: Two times a day (BID) | ORAL | 0 refills | Status: DC

## 2024-01-30 MED ORDER — ALBUTEROL SULFATE HFA 108 (90 BASE) MCG/ACT IN AERS: 2.0000 | INHALATION_SPRAY | Freq: Four times a day (QID) | RESPIRATORY_TRACT | 0 refills | Status: DC | PRN
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
## 2024-02-29 ENCOUNTER — Ambulatory Visit: Admitting: Obstetrics & Gynecology

## 2024-03-19 ENCOUNTER — Ambulatory Visit: Admitting: Medical

## 2024-03-20 ENCOUNTER — Ambulatory Visit: Payer: Self-pay

## 2024-03-20 NOTE — Telephone Encounter (Signed)
 FYI Only or Action Required?: FYI only for provider.  Patient was last seen in primary care on 01/30/2024 by Almarie Waddell NOVAK, NP.  Called Nurse Triage reporting Chest Pain.  Symptoms began yesterday.  Interventions attempted: Rest, hydration, or home remedies.  Symptoms are: unchanged.  Triage Disposition: Go to ED Now (or PCP Triage)  Patient/caregiver understands and will follow disposition?: Unsure  Copied from CRM #[id number]. Topic: Clinical - Red Word Triage >> Mar 20, 2024 10:57 AM Rosina BIRCH wrote: Reason for CRM; mucus in her chest and her chest is heavy around her heart, headache around her ears, feeling cold and temp-97.1 Reason for Disposition  [1] Chest pain lasts > 5 minutes AND [2] occurred in past 3 days (72 hours) (Exception: Feels exactly the same as previously diagnosed heartburn and has accompanying sour taste in mouth.)  Additional Information  Negative: [1] Chest pain lasts > 5 minutes AND [2] described as crushing, pressure-like, or heavy    Explained heaviness feeling of congestion stuck, unable to expectorate. Denies feeling cardiac chest pain  Answer Assessment - Initial Assessment Questions 1. LOCATION: Where does it hurt?       Chest left sided 2. RADIATION: Does the pain go anywhere else? (e.g., into neck, jaw, arms, back)     No 3. ONSET: When did the chest pain begin? (Minutes, hours or days)      5pm 03/19/24 feels like mucous 4. PATTERN: Does the pain come and go, or has it been constant since it started?  Does it get worse with exertion?      Constant 5. DURATION: How long does it last (e.g., seconds, minutes, hours)     Continuous since 03/19/24 approximately 5pm 6. SEVERITY: How bad is the pain?  (e.g., Scale 1-10; mild, moderate, or severe)     Describes as heaviness from congestion she is unable to expectorate 7. CARDIAC RISK FACTORS: Do you have any history of heart problems or risk factors for heart disease? (e.g., angina, prior heart  attack; diabetes, high blood pressure, high cholesterol, smoker, or strong family history of heart disease)      8. PULMONARY RISK FACTORS: Do you have any history of lung disease?  (e.g., blood clots in lung, asthma, emphysema, birth control pills)      9. CAUSE: What do you think is causing the chest pain?     Congestion 10. OTHER SYMPTOMS: Do you have any other symptoms? (e.g., dizziness, nausea, vomiting, sweating, fever, difficulty breathing, cough)  Headache, ear pain, chills.   Additional info: Feels all symptoms are from chest congestion. Requesting office visit, aware of need for evaluation today. They will seek care at local urgent care.  Protocols used: Chest Pain-A-AH

## 2024-04-13 ENCOUNTER — Ambulatory Visit (INDEPENDENT_AMBULATORY_CARE_PROVIDER_SITE_OTHER): Admitting: Student

## 2024-04-13 ENCOUNTER — Encounter: Payer: Self-pay | Admitting: Student

## 2024-04-13 ENCOUNTER — Ambulatory Visit: Payer: Self-pay

## 2024-04-13 VITALS — BP 122/78 | HR 99 | Temp 98.7°F | Resp 12 | Ht 63.0 in | Wt 323.0 lb

## 2024-04-13 DIAGNOSIS — N390 Urinary tract infection, site not specified: Secondary | ICD-10-CM | POA: Diagnosis not present

## 2024-04-13 DIAGNOSIS — N898 Other specified noninflammatory disorders of vagina: Secondary | ICD-10-CM | POA: Diagnosis not present

## 2024-04-13 LAB — POCT URINALYSIS DIP (CLINITEK)
Bilirubin, UA: NEGATIVE
Blood, UA: NEGATIVE
Glucose, UA: NEGATIVE mg/dL
Ketones, POC UA: NEGATIVE mg/dL
Nitrite, UA: NEGATIVE
Spec Grav, UA: >=1.03 — AB (ref 1.010–1.025)
Urobilinogen, UA: 0.2 U/dL
pH, UA: 6 (ref 5.0–8.0)

## 2024-04-13 MED ORDER — FLUCONAZOLE 150 MG PO TABS: 150.0000 mg | ORAL_TABLET | Freq: Every day | ORAL | 0 refills | Status: DC

## 2024-04-13 NOTE — Progress Notes (Signed)
 No chief complaint on file.   Tara Porter is a 28 y.o. female here for possible UTI.  Pt has Hx of recurring UTI- Recently treated 04/03/24 for UTI with hematuria on with ciprofloxacin- finished last pill this morning Treated for UTI 03/24/2024 with Keflex.   Denies vaginal discharge. Denies new sexual partners.  Patient states partner has had epididymitis recently.  Duration: 1 days. Symptoms:  urinary urgency, uncomfortable feeling, slight pink hugh in urine Denies: hematuria, fever, chills, nausea, and vomiting, dysuria Hx of recurrent UTI? Yes Denies new sexual partners.  Past Medical History:  Diagnosis Date   Anxiety    Gestational diabetes    PCOS (polycystic ovarian syndrome)    PCOS (polycystic ovarian syndrome)    Prediabetes    UTI (lower urinary tract infection)      There were no vitals taken for this visit. General: Awake, alert, appears stated age Heart: RRR Lungs: CTAB, normal respiratory effort, no accessory muscle usage Abd: BS+, soft, NT, ND, no masses or organomegaly MSK: No CVA tenderness,  Psych: Age appropriate judgment and insight  No diagnosis found.  Patient finished course of ciprofloxacin this morning.  UA showed small leuks, negative nitrite, negative for blood.  Urine culture pending. Patient has been on antibiotics for UTIs for the past few weeks, current symptoms likely r/t yeast infection related.  Sure swab pending. Tx based on results Rx- Diflucan .  RTC for persistent or worsening symptoms. If UTI reoccurs, consider Urology referral.  Stay hydrated. Seek immediate care if pt starts to develop fevers, new/worsening symptoms, fever, uncontrollable N/V. F/u prn. The patient voiced understanding and agreement to the plan.  Harlene LITTIE Jolly, DNP, AGNP-C 04/13/24 10:07 AM

## 2024-04-13 NOTE — Telephone Encounter (Signed)
 FYI Only or Action Required?: FYI only for provider.  Patient was last seen in primary care on 01/30/2024 by Almarie Waddell NOVAK, NP.  Called Nurse Triage reporting Hematuria.  Symptoms began several weeks ago.  Interventions attempted: Prescription medications: antibiotics (2 rounds, 7 day course and 10 day course) and Rest, hydration, or home remedies.  Symptoms are: hematuria, vaginal pain, burning/painful urination, nausea, vomiting (2 episodes yesterday)was improving and worsened today.  Triage Disposition: See Physician Within 24 Hours  Patient/caregiver understands and will follow disposition?:  Yes          Copied from CRM #[id number]. Topic: Clinical - Red Word Triage >> Apr 13, 2024  9:12 AM Larissa RAMAN wrote: [facility] that prompted transfer to Nurse Triage: blood in urine Reason for Disposition  [1] Taking antibiotic > 72 hours (3 days) for UTI AND [2] painful urination or frequency is SAME (unchanged, not better)  Answer Assessment - Initial Assessment Questions 1. MAIN SYMPTOM: What is the main symptom you are concerned about? (e.g., painful urination, urine frequency)     Blood in urine (pink yesterday, increased/worsening today. She states there were blood clots 9 days ago when she was seen in the ED; but today is light red blood on toilet tissue only and urine is yellow), vaginal pain this morning.   2. BETTER-SAME-WORSE: Are you getting better, staying the same, or getting worse compared to how you felt at your last visit to the doctor (most recent medical visit)?     Better, she states it seemed like it was improving and going away until this morning.  3. PAIN: How bad is the pain?  (e.g., Scale 1-10; mild, moderate, or severe)     4-5/10.  4. FEVER: Do you have a fever? If Yes, ask: What is it, how was it measured, and when did it start?     No.  5. OTHER SYMPTOMS: Do you have any other symptoms? (e.g., blood in the urine, flank pain, vaginal  discharge)     She states she was having some pain in her back previously but none present today; nausea, vomiting (x 2 episodes yesterday), burning/painful urination. Denies diarrhea, fever, urinary frequency.  6. DIAGNOSIS: When was the UTI diagnosed? By whom? Was it a kidney infection, bladder infection or both?     August 9th, urgent care. Again 04/03/24 at ED.  7. ANTIBIOTIC: What antibiotic(s) are you taking? How many times per day?     Unsure of the name, states citro-placin. Had taken 7 day course of Keflex.  8. ANTIBIOTIC - START DATE: When did you start taking the antibiotic?     9 days ago.  Protocols used: Urinary Tract Infection on Antibiotic Follow-up Call - [facility]

## 2024-04-14 LAB — SURESWAB® ADVANCED VAGINITIS PLUS,TMA
C. trachomatis RNA, TMA: NOT DETECTED
CANDIDA SPECIES: DETECTED — AB
Candida glabrata: NOT DETECTED
N. gonorrhoeae RNA, TMA: NOT DETECTED
SURESWAB(R) ADV BACTERIAL VAGINOSIS(BV),TMA: NEGATIVE
TRICHOMONAS VAGINALIS (TV),TMA: NOT DETECTED

## 2024-04-15 ENCOUNTER — Ambulatory Visit: Payer: Self-pay | Admitting: Student

## 2024-04-15 DIAGNOSIS — N3000 Acute cystitis without hematuria: Secondary | ICD-10-CM

## 2024-04-16 LAB — URINE CULTURE
MICRO NUMBER:: 16902553
SPECIMEN QUALITY:: ADEQUATE

## 2024-04-16 MED ORDER — NITROFURANTOIN MONOHYD MACRO 100 MG PO CAPS: 100.0000 mg | ORAL_CAPSULE | Freq: Two times a day (BID) | ORAL | 0 refills | Status: DC

## 2024-04-17 ENCOUNTER — Ambulatory Visit: Admitting: Family Medicine

## 2024-04-18 ENCOUNTER — Ambulatory Visit: Payer: Self-pay

## 2024-04-18 NOTE — Telephone Encounter (Signed)
 FYI Only or Action Required?: Action required by provider: update on patient condition.  Patient was last seen in primary care on 04/13/2024 by Wheeler Harlene CROME, NP.  Called Nurse Triage reporting Chest Pain.  Symptoms began today.  Interventions attempted: Nothing.  Symptoms are: unchanged.  Triage Disposition: Go to ED Now (or PCP Triage)  Patient/caregiver understands and will follow disposition?: Yes  Copied from CRM [id number]. Topic: Clinical - Red Word Triage >> Apr 18, 2024  3:48 PM Chasity T wrote: [facility] that prompted transfer to Nurse Triage: [place] husband of patient is on the line with concerns regarding medication nitrofurantoin , macrocrystal-monohydrate, (MACROBID ) 100 MG capsule that was just given to her by pcp. She is having side affects such as chills, sever pain and chest pain left side Reason for Disposition  Patient sounds very sick or weak to the triager  Answer Assessment - Initial Assessment Questions 1. LOCATION: Where does it hurt?       Left sided chest pain 2. RADIATION: Does the pain go anywhere else? (e.g., into neck, jaw, arms, back)     Radiates around to back 3. ONSET: When did the chest pain begin? (Minutes, hours or days)      12 hours ago 4. PATTERN: Does the pain come and go, or has it been constant since it started?  Does it get worse with exertion?      constant 5. DURATION: How long does it last (e.g., seconds, minutes, hours)     constant 6. SEVERITY: How bad is the pain?  (e.g., Scale 1-10; mild, moderate, or severe)     6/10 7. CARDIAC RISK FACTORS: Do you have any history of heart problems or risk factors for heart disease? (e.g., angina, prior heart attack; diabetes, high blood pressure, high cholesterol, smoker, or strong family history of heart disease)     denies 8. PULMONARY RISK FACTORS: Do you have any history of lung disease?  (e.g., blood clots in lung, asthma, emphysema, birth control pills)      denies 9. CAUSE: What do you think is causing the chest pain?     New antibiotic, Macrobid  10. OTHER SYMPTOMS: Do you have any other symptoms? (e.g., dizziness, nausea, vomiting, sweating, fever, difficulty breathing, cough)       Bloated, gassy, palpitations 11. PREGNANCY: Is there any chance you are pregnant? When was your last menstrual period?      LMP 03/26/2024  Protocols used: Chest Pain-A-AH

## 2024-04-18 NOTE — Telephone Encounter (Signed)
FYI. Pt going to ED.  

## 2024-04-23 ENCOUNTER — Encounter: Payer: Self-pay | Admitting: Family Medicine

## 2024-04-23 ENCOUNTER — Ambulatory Visit (INDEPENDENT_AMBULATORY_CARE_PROVIDER_SITE_OTHER): Admitting: Family Medicine

## 2024-04-23 VITALS — BP 141/71 | HR 80 | Ht 63.0 in | Wt 320.0 lb

## 2024-04-23 DIAGNOSIS — F419 Anxiety disorder, unspecified: Secondary | ICD-10-CM

## 2024-04-23 DIAGNOSIS — K219 Gastro-esophageal reflux disease without esophagitis: Secondary | ICD-10-CM | POA: Diagnosis not present

## 2024-04-23 DIAGNOSIS — N390 Urinary tract infection, site not specified: Secondary | ICD-10-CM

## 2024-04-23 NOTE — Patient Instructions (Signed)
 Lifestyle measures for reflux: - Avoid meals or carbonated beverages within 3 hours of bedtime - Minimize intake of fried, fatty, and spicy foods (this will help decrease gastric acid production) - Raise the head of the bed using 4-6 inch blocks (especially if symptoms are present at night) - Maintain a healthy weight and avoid tight fitting clothes, especially around the waist  - Avoid foods that relax the sphincter or worsen symptoms (chocolate, peppermint, fatty foods, citrus, spicy foods, tomatoes, coffee, caffeine)  - Minimize use of NSAIDs (ibuprofen, Aleve, etc), nicotine, and alcohol

## 2024-04-23 NOTE — Progress Notes (Signed)
 Acute Office Visit  Subjective:     Patient ID: Tara Porter, female    DOB: [birth year], 30 y.o.   MRN: [id number]  Chief Complaint  Patient presents with   GI Problem     Patient is in today for follow-up on recent GERD and UTI.     Discussed the use of AI scribe software for clinical note transcription with the patient, who gave verbal consent to proceed.  History of Present Illness Tara Porter is a 28 year old female with gastroesophageal reflux disease who presents with worsening heartburn.  She has been experiencing worsening heartburn, particularly at night and in the morning, which has led her to sleep in a recliner for the past five days. Acid reflux occurs at night, and she has been drinking ginger tea to alleviate symptoms. Chocolate and seasoned foods exacerbate her symptoms. She has been taking omeprazole  once daily and recently started Pepcid at night, as prescribed by the ER after a visit for chest pain attributed to reflux - cardiac workup negative. She is unsure if the new medication regimen has made a difference yet.  She has a recent history of recurrent urinary tract infections and recently completed a course of antibiotics, finishing yesterday. She has been experiencing UTIs for about a month, with treatment involving three different antibiotics, including Cipro, Keflex, and most recently, Macrobid , which she completed yesterday. Her symptoms have improved, with no fever or burning during urination, although she started her menstrual cycle yesterday, complicating symptom assessment. She was also recently treated with Diflucan  for vaginal candidiasis - no recurrent symptoms.        ROS All review of systems negative except what is listed in the HPI      Objective:    BP (!) 141/71   Pulse 80   Ht 5' 3 (1.6 m)   Wt (!) 320 lb (145.2 kg)   SpO2 99%   BMI 56.69 kg/m    Physical Exam Vitals reviewed.  Constitutional:       Appearance: Normal appearance. She is obese.  Cardiovascular:     Rate and Rhythm: Normal rate and regular rhythm.  Pulmonary:     Effort: Pulmonary effort is normal.     Breath sounds: Normal breath sounds.  Skin:    General: Skin is warm and dry.  Neurological:     Mental Status: She is alert and oriented to person, place, and time.  Psychiatric:        Mood and Affect: Mood normal.        Behavior: Behavior normal.        Thought Content: Thought content normal.        Judgment: Judgment normal.          No results found for any visits on 04/23/24.      Assessment & Plan:   Problem List Items Addressed This Visit       Active Problems   Anxiety   Other Visit Diagnoses       Urinary tract infection without hematuria, site unspecified    -  Primary   Relevant Orders   Urinalysis w microscopic + reflex cultur     Gastroesophageal reflux disease, unspecified whether esophagitis present          Assessment & Plan Gastroesophageal reflux disease (GERD) GERD symptoms worsened, likely due to dietary triggers and recent antibiotic use affecting flora. - Increase Pepcid to twice daily, morning and night. - Continue omeprazole  once daily  in the morning. - Avoid dietary triggers including spicy foods, caffeine, carbonation, and chocolate. - Start a high-dose multistrain probiotic for at least one month.  Urinary tract infection, site not specified Recent UTI treated with Macrobid , symptoms improved but menstruation may affect perception. Previous cultures showed resistance. - Order urine culture to check for persistent infection. - Advise to monitor for UTI symptoms post-menstruation and return if symptoms recur.  Vulvovaginal candidiasis, resolved Resolved candidiasis post-antibiotic use. - Advise to monitor for symptoms post-menstruation and return if symptoms recur.  Mood Slightly worse recently with healthy issues. Seeing a Veterinary surgeon. No SI/HI.           No orders of the defined types were placed in this encounter.   Return if symptoms worsen or fail to improve.  Waddell KATHEE Mon, NP

## 2024-04-24 ENCOUNTER — Ambulatory Visit: Payer: Self-pay

## 2024-04-24 NOTE — Telephone Encounter (Signed)
 Copied from CRM [id number]. Topic: Clinical - Lab/Test Results >> Apr 24, 2024  4:37 PM Tara Porter wrote: Reason for CRM: patient would like a call back to go over lab results because she's concerned if she may have a UTI and having back and kidney pains

## 2024-04-24 NOTE — Telephone Encounter (Signed)
Still awaiting urine culture

## 2024-04-24 NOTE — Telephone Encounter (Signed)
 FYI Only or Action Required?: FYI only for provider.  Patient was last seen in primary care on 04/23/2024 by Almarie Waddell NOVAK, NP.  Called Nurse Triage reporting Flank Pain.  Symptoms began today.  Interventions attempted: Nothing.  Symptoms are: stable.  Triage Disposition: See PCP When Office is Open (Within 3 Days)  Patient/caregiver understands and will follow disposition?: Yes Reason for Disposition  [1] MILD pain (i.e., scale 1-3; does not interfere with normal activities) AND [2] present > 3 days  Answer Assessment - Initial Assessment Questions Sinus issues and mucous, suspected GERD. Currently on menstrual cycle. Still urinating a lot, not really burning with urination.  1. LOCATION: Where does it hurt? (e.g., left, right)     Both side, mid and lower  2. ONSET: When did the pain start?     Today  3. SEVERITY: How bad is the pain? (e.g., Scale 1-10; mild, moderate, or severe)     4-5/10  4. PATTERN: Does the pain come and go, or is it constant?      Constant   5. OTHER SYMPTOMS:  Do you have any other symptoms? (e.g., fever, abdomen pain, vomiting, leg weakness, burning with urination, blood in urine)     Bloating and gas  7. PREGNANCY:  Is there any chance you are pregnant? When was your last menstrual period?     Unsure  Protocols used: Flank Pain-A-AH  Copied from CRM [id number]. Topic: Clinical - Red Word Triage >> Apr 24, 2024  4:40 PM Shereese L wrote: [facility] that prompted transfer to Nurse Triage: back and kidney pains

## 2024-04-25 ENCOUNTER — Ambulatory Visit: Payer: Self-pay | Admitting: Family Medicine

## 2024-04-25 LAB — CULTURE INDICATED

## 2024-04-25 LAB — URINALYSIS W MICROSCOPIC + REFLEX CULTURE
Bilirubin Urine: NEGATIVE
Glucose, UA: NEGATIVE
Hyaline Cast: NONE SEEN /LPF
Ketones, ur: NEGATIVE
Nitrites, Initial: NEGATIVE
Specific Gravity, Urine: 1.021 (ref 1.001–1.035)
pH: 6.5 (ref 5.0–8.0)

## 2024-04-25 LAB — URINE CULTURE
MICRO NUMBER:: 16939358
Result:: NO GROWTH
SPECIMEN QUALITY:: ADEQUATE

## 2024-04-26 ENCOUNTER — Ambulatory Visit: Admitting: Family Medicine

## 2024-05-11 ENCOUNTER — Other Ambulatory Visit: Payer: Self-pay | Admitting: Medical Genetics

## 2024-05-22 ENCOUNTER — Ambulatory Visit: Admitting: Family

## 2024-05-22 VITALS — BP 128/66 | HR 69 | Temp 98.6°F | Resp 16 | Ht 63.0 in | Wt 314.0 lb

## 2024-05-22 DIAGNOSIS — M545 Low back pain, unspecified: Secondary | ICD-10-CM | POA: Insufficient documentation

## 2024-05-22 DIAGNOSIS — E282 Polycystic ovarian syndrome: Secondary | ICD-10-CM

## 2024-05-22 MED ORDER — METHOCARBAMOL 500 MG PO TABS: 500.0000 mg | ORAL_TABLET | Freq: Three times a day (TID) | ORAL | 0 refills | Status: AC | PRN

## 2024-05-22 MED ORDER — NAPROXEN 500 MG PO TABS: 500.0000 mg | ORAL_TABLET | Freq: Two times a day (BID) | ORAL | 0 refills | Status: AC | PRN

## 2024-05-22 NOTE — Progress Notes (Unsigned)
 Subjective:     Patient ID: Tara Porter, female    DOB: 08/26/1995, 28 y.o.   MRN: [id number]  Chief Complaint  Patient presents with   Back Pain    Patient complains of low back pain   PCOS    History of PCOS   Menorrhagia    Patient complains of heavy and painful periods    Back Pain    Discussed the use of AI scribe software for clinical note transcription with the patient, who gave verbal consent to proceed.  History of Present Illness  Tara Porter is a 28 year old female with PCOS who presents with severe menstrual and back pain.  Over the last few months, her menstrual pain has intensified to an excruciating level. The pain is constant and located in the back and lower abdomen, which is a new symptom.  She visited the ER last night due to unbearable pain. A pelvic ultrasound revealed small bilateral ovarian cysts, including a 4 cm cyst on the right ovary and a 3.5 cm cyst on the left ovary.  She has been taking Yaz for six months without symptom relief and has discontinued it. Over-the-counter medications like Tylenol  and ibuprofen  have been ineffective. She took three ibuprofen  (600 mg) and later two Aleve, resulting in a heavy feeling in her head. She typically alternates between Tylenol  and ibuprofen  for pain management.     There are no preventive care reminders to display for this patient.  Past Medical History:  Diagnosis Date   Anxiety    Gestational diabetes    PCOS (polycystic ovarian syndrome)    PCOS (polycystic ovarian syndrome)    Prediabetes    UTI (lower urinary tract infection)     Past Surgical History:  Procedure Laterality Date   CESAREAN SECTION N/A 10/16/2017   Procedure: CESAREAN SECTION;  Surgeon: Rutherford Gain, MD;  Location: [facility];  Service: Obstetrics;  Laterality: N/A;   TONSILLECTOMY      Family History  Problem Relation Age of Onset   Heart failure Father    Diabetes Mother     Raynaud syndrome Mother     Social History   Socioeconomic History   Marital status: Married    Spouse name: Not on file   Number of children: Not on file   Years of education: Not on file   Highest education level: GED or equivalent  Occupational History   Not on file  Tobacco Use   Smoking status: Never   Smokeless tobacco: Never  Substance and Sexual Activity   Alcohol use: No   Drug use: No   Sexual activity: Yes    Birth control/protection: None  Other Topics Concern   Not on file  Social History Narrative   Not on file   Social Drivers of Health   Financial Resource Strain: Medium Risk (01/30/2024)   Overall Financial Resource Strain (CARDIA)    Difficulty of Paying Living Expenses: Somewhat hard  Food Insecurity: Unknown (04/18/2024)   Received from [facility]   Hunger Vital Sign    Within the past 12 months, you worried that your food would run out before you got money to buy more: Patient declined to answer    Within the past 12 months, the food you bought just didn't last and you didn't have money to get more. : Patient declined to answer  Transportation Needs: No Transportation Needs (04/18/2024)   Received from [facility]  In the past 12 months, has lack of reliable transportation kept you from medical appointments, meetings, work or from getting things needed for daily living? : No  Physical Activity: Insufficiently Active (01/30/2024)   Exercise Vital Sign    Days of Exercise per Week: 3 days    Minutes of Exercise per Session: 30 min  Stress: Stress Concern Present (01/30/2024)   [employer] of Occupational Health - Occupational Stress Questionnaire    Feeling of Stress: To some extent  Social Connections: Moderately Isolated (01/30/2024)   Social Connection and Isolation Panel    Frequency of Communication with Friends and Family: More than three times a week    Frequency of Social Gatherings with Friends and Family: Once a week     Attends Religious Services: Never    Database administrator or Organizations: No    Attends Engineer, structural: Not on file    Marital Status: Married  Catering manager Violence: Not At Risk (05/21/2024)   Received from [facility]   HITS    Over the last 12 months how often did your partner physically hurt you?: Never    Over the last 12 months how often did your partner insult you or talk down to you?: Never    Over the last 12 months how often did your partner threaten you with physical harm?: Never    Over the last 12 months how often did your partner scream or curse at you?: Never    Outpatient Medications Prior to Visit  Medication Sig Dispense Refill   Ferrous Sulfate  (IRON) 325 (65 Fe) MG TABS Take 325 mg by mouth every other day.     omeprazole  (PRILOSEC) 40 MG capsule Take 1 capsule (40 mg total) by mouth daily. 30 capsule 3   No facility-administered medications prior to visit.    Allergies  Allergen Reactions   Amoxicillin Hives    Has patient had a PCN reaction causing immediate rash, facial/tongue/throat swelling, SOB or lightheadedness with hypotension: no Has patient had a PCN reaction causing severe rash involving mucus membranes or skin necrosis: no Has patient had a PCN reaction that required hospitalizationno Has patient had a PCN reaction occurring within the last 10 years: no If all of the above answers are NO, then may proceed with Cephalosporin use.      Review of Systems  Musculoskeletal:  Positive for back pain.       Objective:    Physical Exam Constitutional:      General: She is not in acute distress.    Appearance: Normal appearance. She is well-developed.  HENT:     Head: Normocephalic and atraumatic.     Right Ear: External ear normal.     Left Ear: External ear normal.  Eyes:     General: No scleral icterus. Neck:     Thyroid : No thyromegaly.  Cardiovascular:     Rate and Rhythm: Normal rate and regular rhythm.      Heart sounds: Normal heart sounds. No murmur heard. Pulmonary:     Effort: Pulmonary effort is normal. No respiratory distress.     Breath sounds: Normal breath sounds. No wheezing.  Musculoskeletal:     Cervical back: Neck supple.  Skin:    General: Skin is warm and dry.  Neurological:     Mental Status: She is alert and oriented to person, place, and time.  Psychiatric:        Mood and Affect: Mood normal.  Behavior: Behavior normal.        Thought Content: Thought content normal.        Judgment: Judgment normal.      BP 128/66 (BP Location: Right Arm, Patient Position: Sitting, Cuff Size: Large)   Pulse 69   Temp 98.6 F (37 C) (Oral)   Resp 16   Ht 5' 3 (1.6 m)   Wt (!) 314 lb (142.4 kg)   LMP 05/19/2024   SpO2 98%   BMI 55.62 kg/m  Wt Readings from Last 3 Encounters:  05/22/24 (!) 314 lb (142.4 kg)  04/23/24 (!) 320 lb (145.2 kg)  04/13/24 (!) 323 lb (146.5 kg)       Assessment & Plan:   Problem List Items Addressed This Visit   None   I am having Tara HERO. [place] maintain her Iron and omeprazole .  No orders of the defined types were placed in this encounter.

## 2024-05-22 NOTE — Assessment & Plan Note (Signed)
 Had pelvic US  yesterday in ED:  No acute process identified.  No evidence for ovarian torsion.  Small bilateral ovarian cysts likely physiologic in etiology.   Recommend that she follow through with upcoming GYN visit to discuss OCP options and possibly further evaluation for endometriosis.

## 2024-05-22 NOTE — Assessment & Plan Note (Signed)
-   Prescribed naproxen 500 mg for pain management. - Prescribed muscle relaxant for pain relief, cautioned about drowsiness. - Advised to avoid additional NSAIDs today, use acetaminophen  instead. OK to start naproxen tomorrow.

## 2024-05-23 NOTE — Patient Instructions (Signed)
 VISIT SUMMARY:  You visited us  today due to severe menstrual and back pain, which has worsened over the past few months. A recent ER visit and pelvic ultrasound revealed small ovarian cysts on both ovaries. Your current birth control has not been effective, and over-the-counter pain medications have not provided relief.  YOUR PLAN:  POLYCYSTIC OVARY SYNDROME (PCOS) WITH BILATERAL OVARIAN CYSTS AND SEVERE DYSMENORRHEA: You have PCOS with small cysts on both ovaries, causing severe menstrual and back pain. Over-the-counter pain medications have not been effective. -Start taking naproxen 500 mg for pain management as prescribed. -Take the prescribed muscle relaxant for pain relief, but be aware it may cause drowsiness. -Avoid taking additional NSAIDs today; use acetaminophen  instead if needed. -You have been referred to a gynecologist for further evaluation and management, including a potential change in birth control.

## 2024-05-24 ENCOUNTER — Encounter: Payer: Self-pay | Admitting: Family Medicine

## 2024-05-24 DIAGNOSIS — K219 Gastro-esophageal reflux disease without esophagitis: Secondary | ICD-10-CM

## 2024-06-04 ENCOUNTER — Other Ambulatory Visit (HOSPITAL_COMMUNITY)
Admission: RE | Admit: 2024-06-04 | Discharge: 2024-06-04 | Disposition: A | Discharge status: Home / Self Care | Source: Ambulatory Visit | Attending: Obstetrics and Gynecology | Admitting: Obstetrics and Gynecology

## 2024-06-04 ENCOUNTER — Ambulatory Visit (INDEPENDENT_AMBULATORY_CARE_PROVIDER_SITE_OTHER): Admitting: Obstetrics and Gynecology

## 2024-06-04 VITALS — BP 126/83 | HR 88 | Wt 312.0 lb

## 2024-06-04 DIAGNOSIS — N930 Postcoital and contact bleeding: Secondary | ICD-10-CM | POA: Diagnosis not present

## 2024-06-04 DIAGNOSIS — N946 Dysmenorrhea, unspecified: Secondary | ICD-10-CM

## 2024-06-04 DIAGNOSIS — N83201 Unspecified ovarian cyst, right side: Secondary | ICD-10-CM

## 2024-06-04 DIAGNOSIS — E282 Polycystic ovarian syndrome: Secondary | ICD-10-CM

## 2024-06-04 NOTE — Progress Notes (Signed)
   ESTABLISHED GYNECOLOGY VISIT Chief Complaint  Patient presents with   pcos    Subjective:  Discussed the use of AI scribe software for clinical note transcription with the patient, who gave verbal consent to proceed.  History of Present Illness   Tara Porter is a 28 year old female with PCOS who presents with worsening menstrual pain and irregular periods.  Over the past six months, menstrual pain has intensified, radiating to her back and affecting her daily activities. Her periods have become more frequent and heavier since stopping birth control pills in July or August, which she initially started in April. She experiences pain and bleeding during intercourse, with bleeding described as both pink and bright red. Two ovarian cysts were identified during an ER visit, measuring approximately 4 cm on the right ovary and 3.5 cm on the left.     Has questions about whether she could have endometriosis. Interested in IUD or POPs  Review of Systems:   Pertinent items are noted in HPI  Pertinent History Reviewed:  Reviewed past medical,surgical, social and family history.  Reviewed problem list, medications and allergies.  Objective:   Vitals:   06/04/24 1129 06/04/24 1134  BP: (!) 141/96 126/83  Pulse: 99 88  Weight: (!) 312 lb (141.5 kg)    Physical Examination:   General appearance - well appearing, and in no distress  Mental status - alert, oriented to person, place, and time  Psych:  normal mood and affect  Skin - warm and dry, normal color, no suspicious lesions noted  Pelvic -  VULVA: normal appearing vulva with no masses, tenderness or lesions   VAGINA: normal appearing vagina with normal color and discharge, no lesions   CERVIX: normal appearing cervix without discharge or lesions     Chaperone present for exam  Assessment and Plan:  Assessment and Plan 1. Dysmenorrhea (Primary) Chronic dysmenorrhea and abnormal uterine bleeding potentially due to  PCOS or endometriosis. Reviewed NSAID therapy with naproxen/ibuprofen . Previous birth control pills ineffective. Discussed hormonal therapy options including a different OCP, which she declines. Reviewed that POPs less likely to be effective for dysmenorrhea. Reviewed IUD in detail and she would like to proceed. IUD expected to reduce bleeding significantly, with potential amenorrhea in 30-40% of women. - Provided IUD handout. - Scheduled IUD insertion. - Advised avoiding unprotected intercourse since last period before IUD appointment.  2. PCOS (polycystic ovarian syndrome) See above  3. Bilateral ovarian cysts Bilateral ovarian cysts identified on [facility] imaging, measuring 4 cm on right ovary and 3.5 cm on left. Suspected to be physiologic - Reviewed imaging from [facility]. - no further f/u needed  4. Postcoital bleeding Postcoital bleeding for six months with normal Pap smear in April. Differential includes cervical pathology or infection. Cervical exam showed no abnormalities. - Performed cervical exam and swab for pelvic infections. - Repeat Pap smear to ensure no missed pathology.  - Consider colpo if normal workup - Cytology - PAP( [place]) - Cervicovaginal ancillary only( )  F/u for IUD insertion    Future Appointments  Date Time Provider Department Center  06/19/2024  9:45 AM [facility] None  07/09/2024  2:50 PM [place], Rollo DASEN, MD [facility] None    Rollo DASEN Bring, MD, FACOG Obstetrician & Gynecologist, [facility] for [facility], [facility]

## 2024-06-06 LAB — CERVICOVAGINAL ANCILLARY ONLY
Chlamydia: NEGATIVE
Comment: NEGATIVE
Comment: NEGATIVE
Comment: NORMAL
Neisseria Gonorrhea: NEGATIVE
Trichomonas: NEGATIVE

## 2024-06-07 ENCOUNTER — Ambulatory Visit: Payer: Self-pay | Admitting: Obstetrics and Gynecology

## 2024-06-07 LAB — CYTOLOGY - PAP
Comment: NEGATIVE
Diagnosis: UNDETERMINED — AB
High risk HPV: NEGATIVE

## 2024-06-07 NOTE — Telephone Encounter (Signed)
 Patient was previously scheduled for an IUD insertion on 11/24. Lengthened appointment and added colpo to the appointment. Left voicemail for the patient with that information.

## 2024-06-07 NOTE — Telephone Encounter (Signed)
-----   Message from Nurse Erminio DEL sent at 06/07/2024  1:36 PM EDT ----- Patient is aware, please schedule ----- Message ----- From: Abigail Rollo DASEN, MD Sent: 06/07/2024   1:21 PM EDT To: [facility] Admin  Schedule for colposcopy ----- Message ----- From: Interface, Lab In [facility] Sent: 06/06/2024   6:42 PM EDT To: Rollo DASEN Abigail, MD

## 2024-06-19 ENCOUNTER — Other Ambulatory Visit

## 2024-06-27 ENCOUNTER — Ambulatory Visit: Admitting: Family Medicine

## 2024-06-27 DIAGNOSIS — E611 Iron deficiency: Secondary | ICD-10-CM

## 2024-06-27 NOTE — Progress Notes (Incomplete)
 Acute Office Visit  Subjective:  Patient ID: Tara Porter, female    DOB: 1996-05-11  Age: 28 y.o. MRN: [id number]  CC: No chief complaint on file.     HPI [facility] is here for Sinus Issues and to test Iron Levels.   Past Medical History:  Diagnosis Date   Anxiety    Gestational diabetes    PCOS (polycystic ovarian syndrome)    PCOS (polycystic ovarian syndrome)    Prediabetes    UTI (lower urinary tract infection)     Past Surgical History:  Procedure Laterality Date   CESAREAN SECTION N/A 10/16/2017   Procedure: CESAREAN SECTION;  Surgeon: Rutherford Gain, MD;  Location: [facility];  Service: Obstetrics;  Laterality: N/A;   TONSILLECTOMY      Family History  Problem Relation Age of Onset   Heart failure Father    Diabetes Mother    Raynaud syndrome Mother     Social History   Socioeconomic History   Marital status: Married    Spouse name: Not on file   Number of children: Not on file   Years of education: Not on file   Highest education level: GED or equivalent  Occupational History   Not on file  Tobacco Use   Smoking status: Never   Smokeless tobacco: Never  Substance and Sexual Activity   Alcohol use: No   Drug use: No   Sexual activity: Yes    Birth control/protection: None  Other Topics Concern   Not on file  Social History Narrative   Not on file   Social Drivers of Health   Financial Resource Strain: Medium Risk (01/30/2024)   Overall Financial Resource Strain (CARDIA)    Difficulty of Paying Living Expenses: Somewhat hard  Food Insecurity: Unknown (04/18/2024)   Received from [facility]   Hunger Vital Sign    Within the past 12 months, you worried that your food would run out before you got money to buy more: Patient declined to answer    Within the past 12 months, the food you bought just didn't last and you didn't have money to get more. : Patient declined to answer  Transportation Needs: No  Transportation Needs (04/18/2024)   Received from [facility]    In the past 12 months, has lack of reliable transportation kept you from medical appointments, meetings, work or from getting things needed for daily living? : No  Physical Activity: Insufficiently Active (01/30/2024)   Exercise Vital Sign    Days of Exercise per Week: 3 days    Minutes of Exercise per Session: 30 min  Stress: Stress Concern Present (01/30/2024)   [facility] [employer] - Occupational Stress Questionnaire    Feeling of Stress: To some extent  Social Connections: Moderately Isolated (01/30/2024)   Social Connection and Isolation Panel    Frequency of Communication with Friends and Family: More than three times a week    Frequency of Social Gatherings with Friends and Family: Once a week    Attends Religious Services: Never    Database Administrator or Organizations: No    Attends Engineer, Structural: Not on file    Marital Status: Married  Catering Manager Violence: Not At Risk (05/21/2024)   Received from [facility]   HITS    Over the last 12 months how often did your partner physically hurt you?: Never    Over the last 12 months how often  did your partner insult you or talk down to you?: Never    Over the last 12 months how often did your partner threaten you with physical harm?: Never    Over the last 12 months how often did your partner scream or curse at you?: Never    ROS All ROS negative except what is listed in the HPI.   Objective:   Today's Vitals: LMP 05/19/2024 (Exact Date)   Physical Exam  Assessment & Plan:   Problem List Items Addressed This Visit     Iron deficiency - Primary      Follow-up: No follow-ups on file.   Tara FURY Almarie, DNP, FNP-C  I,Tara Porter,acting as a neurosurgeon for Tara KATHEE Almarie, NP.,have documented all relevant documentation on the behalf of Tara KATHEE Almarie, NP.   I, Tara KATHEE Almarie, NP, have reviewed all  documentation for this visit. The documentation on 06/27/2024 for the exam, diagnosis, procedures, and orders are all accurate and complete.

## 2024-07-09 ENCOUNTER — Encounter: Admitting: Obstetrics and Gynecology

## 2024-07-17 ENCOUNTER — Encounter: Payer: Self-pay | Admitting: Gastroenterology

## 2024-07-19 ENCOUNTER — Ambulatory Visit: Admitting: Family Medicine

## 2024-07-19 NOTE — Progress Notes (Incomplete)
 Acute Office Visit  Subjective:  Patient ID: Tara Porter, female    DOB: 09/22/1995  Age: 28 y.o. MRN: [id number]  CC: No chief complaint on file.     HPI [name] is here for ***.   Past Medical History:  Diagnosis Date   Anxiety    Gestational diabetes    PCOS (polycystic ovarian syndrome)    PCOS (polycystic ovarian syndrome)    Prediabetes    UTI (lower urinary tract infection)     Past Surgical History:  Procedure Laterality Date   CESAREAN SECTION N/A 10/16/2017   Procedure: CESAREAN SECTION;  Surgeon: Rutherford Gain, MD;  Location: [facility];  Service: Obstetrics;  Laterality: N/A;   TONSILLECTOMY      Family History  Problem Relation Age of Onset   Heart failure Father    Diabetes Mother    Raynaud syndrome Mother     Social History   Socioeconomic History   Marital status: Married    Spouse name: Not on file   Number of children: Not on file   Years of education: Not on file   Highest education level: GED or equivalent  Occupational History   Not on file  Tobacco Use   Smoking status: Never   Smokeless tobacco: Never  Substance and Sexual Activity   Alcohol use: No   Drug use: No   Sexual activity: Yes    Birth control/protection: None  Other Topics Concern   Not on file  Social History Narrative   Not on file   Social Drivers of Health   Financial Resource Strain: Medium Risk (07/18/2024)   Overall Financial Resource Strain (CARDIA)    Difficulty of Paying Living Expenses: Somewhat hard  Food Insecurity: Food Insecurity Present (07/18/2024)   Hunger Vital Sign    Worried About Running Out of Food in the Last Year: Sometimes true    Ran Out of Food in the Last Year: Never true  Transportation Needs: No Transportation Needs (07/18/2024)   PRAPARE - Administrator, Civil Service (Medical): No    Lack of Transportation (Non-Medical): No  Physical Activity: Insufficiently Active (07/18/2024)    Exercise Vital Sign    Days of Exercise per Week: 4 days    Minutes of Exercise per Session: 30 min  Stress: Stress Concern Present (07/18/2024)   [employer] of Occupational Health - Occupational Stress Questionnaire    Feeling of Stress: To some extent  Social Connections: Moderately Isolated (07/18/2024)   Social Connection and Isolation Panel    Frequency of Communication with Friends and Family: More than three times a week    Frequency of Social Gatherings with Friends and Family: Once a week    Attends Religious Services: Never    Database Administrator or Organizations: No    Attends Engineer, Structural: Not on file    Marital Status: Married  Catering Manager Violence: Not At Risk (05/21/2024)   Received from [facility]   HITS    Over the last 12 months how often did your partner scream or curse at you?: Never    Over the last 12 months how often did your partner threaten you with physical harm?: Never    Over the last 12 months how often did your partner insult you or talk down to you?: Never    Over the last 12 months how often did your partner physically hurt you?: Never    ROS All ROS  negative except what is listed in the HPI.   Objective:   Today's Vitals: There were no vitals taken for this visit.  Physical Exam  Assessment & Plan:   Problem List Items Addressed This Visit   None     Follow-up: No follow-ups on file.   Waddell FURY Almarie, DNP, FNP-C  I,Emily Lagle,acting as a neurosurgeon for Waddell KATHEE Almarie, NP.,have documented all relevant documentation on the behalf of Waddell KATHEE Almarie, NP.   I, Waddell KATHEE Almarie, NP, have reviewed all documentation for this visit. The documentation on 07/19/2024 for the exam, diagnosis, procedures, and orders are all accurate and complete.

## 2024-07-24 ENCOUNTER — Ambulatory Visit: Admitting: Family Medicine

## 2024-07-25 ENCOUNTER — Other Ambulatory Visit

## 2024-07-26 ENCOUNTER — Ambulatory Visit: Admitting: Family Medicine

## 2024-07-26 DIAGNOSIS — G4719 Other hypersomnia: Secondary | ICD-10-CM

## 2024-07-27 ENCOUNTER — Ambulatory Visit: Admitting: Family Medicine

## 2024-08-17 ENCOUNTER — Encounter: Payer: Self-pay | Admitting: Family Medicine

## 2024-08-17 ENCOUNTER — Other Ambulatory Visit: Payer: Self-pay | Admitting: Neurology

## 2024-08-17 DIAGNOSIS — R12 Heartburn: Secondary | ICD-10-CM

## 2024-08-17 MED ORDER — OMEPRAZOLE 40 MG PO CPDR: 40.0000 mg | DELAYED_RELEASE_CAPSULE | Freq: Every day | ORAL | 0 refills | Status: DC

## 2024-09-10 ENCOUNTER — Ambulatory Visit: Admitting: Gastroenterology

## 2024-09-12 ENCOUNTER — Other Ambulatory Visit: Payer: Self-pay | Admitting: Family Medicine

## 2024-09-12 DIAGNOSIS — R12 Heartburn: Secondary | ICD-10-CM

## 2024-09-19 ENCOUNTER — Other Ambulatory Visit: Payer: Self-pay | Admitting: Medical Genetics

## 2024-09-19 DIAGNOSIS — Z006 Encounter for examination for normal comparison and control in clinical research program: Secondary | ICD-10-CM

## 2024-09-25 ENCOUNTER — Ambulatory Visit: Admitting: Family Medicine
